# Patient Record
Sex: Female | Born: 1975 | Race: White | Hispanic: No | Marital: Single | Smoking: Former smoker
Health system: Southern US, Community
[De-identification: ages and names within clinical notes are randomized; demographics above are authoritative.]

## PROBLEM LIST (undated history)

## (undated) ENCOUNTER — Inpatient Hospital Stay (HOSPITAL_COMMUNITY): Payer: Self-pay

## (undated) DIAGNOSIS — J45909 Unspecified asthma, uncomplicated: Secondary | ICD-10-CM

## (undated) HISTORY — PX: NO PAST SURGERIES: SHX2092

---

## 2003-07-21 ENCOUNTER — Encounter: Admission: RE | Admit: 2003-07-21 | Discharge: 2003-07-21 | Payer: Self-pay | Admitting: Family Medicine

## 2003-07-21 ENCOUNTER — Other Ambulatory Visit: Admission: RE | Admit: 2003-07-21 | Discharge: 2003-07-21 | Payer: Self-pay | Admitting: Family Medicine

## 2003-08-04 ENCOUNTER — Encounter: Admission: RE | Admit: 2003-08-04 | Discharge: 2003-08-04 | Payer: Self-pay | Admitting: Family Medicine

## 2004-02-02 ENCOUNTER — Encounter: Admission: RE | Admit: 2004-02-02 | Discharge: 2004-02-02 | Payer: Self-pay | Admitting: Obstetrics and Gynecology

## 2004-05-22 ENCOUNTER — Encounter: Admission: RE | Admit: 2004-05-22 | Discharge: 2004-05-22 | Payer: Self-pay | Admitting: Obstetrics and Gynecology

## 2004-07-17 ENCOUNTER — Encounter: Admission: RE | Admit: 2004-07-17 | Discharge: 2004-07-17 | Payer: Self-pay | Admitting: Obstetrics and Gynecology

## 2004-08-06 ENCOUNTER — Ambulatory Visit (HOSPITAL_COMMUNITY): Admission: RE | Admit: 2004-08-06 | Discharge: 2004-08-06 | Payer: Self-pay | Admitting: Obstetrics and Gynecology

## 2004-08-06 ENCOUNTER — Ambulatory Visit: Payer: Self-pay | Admitting: Obstetrics and Gynecology

## 2004-08-16 ENCOUNTER — Inpatient Hospital Stay (HOSPITAL_COMMUNITY): Admission: AD | Admit: 2004-08-16 | Discharge: 2004-08-17 | Payer: Self-pay | Admitting: Family Medicine

## 2004-08-16 ENCOUNTER — Ambulatory Visit: Payer: Self-pay | Admitting: Obstetrics and Gynecology

## 2004-08-28 ENCOUNTER — Ambulatory Visit: Payer: Self-pay | Admitting: Obstetrics and Gynecology

## 2004-10-25 ENCOUNTER — Ambulatory Visit: Payer: Self-pay | Admitting: Obstetrics and Gynecology

## 2005-01-15 ENCOUNTER — Ambulatory Visit: Payer: Self-pay | Admitting: Obstetrics and Gynecology

## 2005-03-11 ENCOUNTER — Inpatient Hospital Stay (HOSPITAL_COMMUNITY): Admission: AD | Admit: 2005-03-11 | Discharge: 2005-03-11 | Payer: Self-pay | Admitting: Obstetrics and Gynecology

## 2005-03-13 ENCOUNTER — Inpatient Hospital Stay (HOSPITAL_COMMUNITY): Admission: AD | Admit: 2005-03-13 | Discharge: 2005-03-13 | Payer: Self-pay | Admitting: Obstetrics and Gynecology

## 2013-04-30 ENCOUNTER — Encounter (HOSPITAL_COMMUNITY): Payer: Self-pay

## 2013-04-30 ENCOUNTER — Telehealth: Payer: Self-pay | Admitting: Family Medicine

## 2013-04-30 ENCOUNTER — Inpatient Hospital Stay (HOSPITAL_COMMUNITY)
Admission: AD | Admit: 2013-04-30 | Discharge: 2013-04-30 | Disposition: A | Discharge status: Home / Self Care | Payer: Medicaid Other | Source: Ambulatory Visit | Attending: Obstetrics and Gynecology | Admitting: Obstetrics and Gynecology

## 2013-04-30 DIAGNOSIS — N949 Unspecified condition associated with female genital organs and menstrual cycle: Secondary | ICD-10-CM

## 2013-04-30 DIAGNOSIS — O2390 Unspecified genitourinary tract infection in pregnancy, unspecified trimester: Secondary | ICD-10-CM | POA: Insufficient documentation

## 2013-04-30 DIAGNOSIS — A499 Bacterial infection, unspecified: Secondary | ICD-10-CM

## 2013-04-30 DIAGNOSIS — N760 Acute vaginitis: Secondary | ICD-10-CM

## 2013-04-30 DIAGNOSIS — R109 Unspecified abdominal pain: Secondary | ICD-10-CM | POA: Insufficient documentation

## 2013-04-30 DIAGNOSIS — O4690 Antepartum hemorrhage, unspecified, unspecified trimester: Secondary | ICD-10-CM | POA: Insufficient documentation

## 2013-04-30 DIAGNOSIS — B9689 Other specified bacterial agents as the cause of diseases classified elsewhere: Secondary | ICD-10-CM

## 2013-04-30 HISTORY — DX: Unspecified asthma, uncomplicated: J45.909

## 2013-04-30 LAB — WET PREP, GENITAL
Trich, Wet Prep: NONE SEEN
Yeast Wet Prep HPF POC: NONE SEEN

## 2013-04-30 LAB — URINALYSIS, ROUTINE W REFLEX MICROSCOPIC
Bilirubin Urine: NEGATIVE
Ketones, ur: NEGATIVE mg/dL
Leukocytes, UA: NEGATIVE
Urobilinogen, UA: 1 mg/dL (ref 0.0–1.0)

## 2013-04-30 LAB — URINE MICROSCOPIC-ADD ON

## 2013-04-30 MED ORDER — ACETAMINOPHEN 500 MG PO TABS
500.0000 mg | ORAL_TABLET | Freq: Once | ORAL | Status: AC
  Administered 2013-04-30: 500 mg via ORAL
  Filled 2013-04-30: qty 1

## 2013-04-30 MED ORDER — METRONIDAZOLE 0.75 % VA GEL: 1.0000 | Freq: Two times a day (BID) | VAGINAL | Status: DC

## 2013-04-30 NOTE — Telephone Encounter (Signed)
Pt called MAU because Metrogel prescription too expensive.  Willing to take pills. Called in Rx to [facility] in [place]. Metronidazole 500 mg BID x 7 days

## 2013-04-30 NOTE — MAU Note (Signed)
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

## 2013-04-30 NOTE — MAU Note (Signed)
Patient states she started having lower abdominal pain for the past 2-3 days, worse last night and continues today. States she started having bright red spotting at 1500. No discharge on the pad patient is wearing. Light yellow discharge at this time.

## 2013-04-30 NOTE — MAU Provider Note (Signed)
History     CSN: [id number]  Arrival date and time: 04/30/13 1514   First Provider Initiated Contact with Patient 04/30/13 1613      Chief Complaint  Patient presents with  . Abdominal Pain  . Vaginal Bleeding   HPI Ms. Krista Sandoval is a 37 y.o. G2P1001 at [redacted]w[redacted]d who presents to MAU today with lower abdominal pain and vaginal bleeding. The patient states that she has had some lower abdominal cramping bilaterally and starting to radiate into her back. 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 The patient also states that she noted 2 episodes of spotting with wiping today. The patient states that she has a slight discharge that she was told was normal in the office. She denies LOF, N/V, fever, UTI symptoms or contractions. She reports good fetal movement.   OB History   Grav Para Term Preterm Abortions TAB SAB Ect Mult Living   2 1 1       1       Past Medical History  Diagnosis Date  . Asthma     Past Surgical History  Procedure Laterality Date  . No past surgeries      History reviewed. No pertinent family history.  History  Substance Use Topics  . Smoking status: Former Games developer  . Smokeless tobacco: Not on file  . Alcohol Use: No    Allergies: No Known Allergies  No prescriptions prior to admission    Review of Systems  Constitutional: Negative for fever and malaise/fatigue.  Gastrointestinal: Positive for abdominal pain. Negative for nausea, vomiting, diarrhea and constipation.  Genitourinary: Negative for dysuria, urgency and frequency.       + vaginal bleeding, discharge Neg - LOF  Musculoskeletal: Positive for back pain.   Physical Exam   Blood pressure 114/63, pulse 73, temperature 98.6 F (37 C), temperature source Oral, resp. rate 16, height 5\' 5"  (1.651 m), weight 164 lb (74.39 kg), SpO2 99.00%.  Physical Exam  Constitutional: She is oriented to person, place, and time. She appears  well-developed and well-nourished. No distress.  HENT:  Head: Normocephalic and atraumatic.  Cardiovascular: Normal rate, regular rhythm and normal heart sounds.   Respiratory: Effort normal and breath sounds normal. No respiratory distress.  GI: Soft. Bowel sounds are normal. She exhibits no distension and no mass. There is tenderness (mild lower abdominal tenderness to palpation). There is no rebound and no guarding.  Genitourinary: Uterus is enlarged (appropriate for GA). Uterus is not tender. Cervix exhibits no motion tenderness, no discharge and no friability. No bleeding around the vagina. Vaginal discharge (scant thin, white discharge noted) found.  Neurological: She is alert and oriented to person, place, and time.  Skin: Skin is warm and dry. No erythema.  Psychiatric: She has a normal mood and affect.  Dilation: Closed Effacement (%): Thick Exam by:: Naaman Plummer PA  Results for orders placed during the hospital encounter of 04/30/13 (from the past 24 hour(s))  URINALYSIS, ROUTINE W REFLEX MICROSCOPIC     Status: Abnormal   Collection Time    04/30/13  4:01 PM      Result Value Range   Color, Urine YELLOW  YELLOW   APPearance CLEAR  CLEAR   Specific Gravity, Urine 1.025  1.005 - 1.030   pH 6.5  5.0 - 8.0   Glucose, UA NEGATIVE  NEGATIVE mg/dL   Hgb urine dipstick SMALL (*) NEGATIVE   Bilirubin Urine  NEGATIVE  NEGATIVE   Ketones, ur NEGATIVE  NEGATIVE mg/dL   Protein, ur NEGATIVE  NEGATIVE mg/dL   Urobilinogen, UA 1.0  0.0 - 1.0 mg/dL   Nitrite NEGATIVE  NEGATIVE   Leukocytes, UA NEGATIVE  NEGATIVE  URINE MICROSCOPIC-ADD ON     Status: None   Collection Time    04/30/13  4:01 PM      Result Value Range   Squamous Epithelial / LPF RARE  RARE   RBC / HPF 7-10  <3 RBC/hpf   Bacteria, UA RARE  RARE  WET PREP, GENITAL     Status: Abnormal   Collection Time    04/30/13  4:25 PM      Result Value Range   Yeast Wet Prep HPF POC NONE SEEN  NONE SEEN   Trich, Wet Prep NONE SEEN   NONE SEEN   Clue Cells Wet Prep HPF POC FEW (*) NONE SEEN   WBC, Wet Prep HPF POC MODERATE (*) NONE SEEN     MAU Course  Procedures None  MDM UA, Wet prep Discussed with Dr. Tenny Craw. Ok to discharge. Rx for Metrogel. Follow-up as scheduled. Discuss round ligament pain.   Assessment and Plan  A: Round ligament pain Bacterial vaginosis  P: Dicharge home Rx for metrogel sent to patient's pharmacy Recommended abdominal binder, tylenol PRN and warm bath for round ligament pain Work excuse note given  Patient encouraged to follow-up in office as scheduled or sooner if symptoms should worsen or do not improve Patient may return to MAU as needed  Freddi Starr, PA-C  04/30/2013, 6:51 PM

## 2014-03-05 ENCOUNTER — Encounter (HOSPITAL_COMMUNITY): Payer: Self-pay | Admitting: *Deleted

## 2014-09-26 ENCOUNTER — Encounter (HOSPITAL_COMMUNITY): Payer: Self-pay | Admitting: *Deleted

## 2015-11-16 ENCOUNTER — Other Ambulatory Visit: Payer: Self-pay

## 2018-08-27 LAB — GLUCOSE, POCT (MANUAL RESULT ENTRY): POC Glucose: 98 mg/dl (ref 70–99)

## 2018-09-24 ENCOUNTER — Other Ambulatory Visit: Payer: Self-pay

## 2018-09-24 ENCOUNTER — Encounter (HOSPITAL_COMMUNITY): Payer: Self-pay | Admitting: Emergency Medicine

## 2018-09-24 ENCOUNTER — Emergency Department (HOSPITAL_COMMUNITY): Payer: Medicaid Other

## 2018-09-24 ENCOUNTER — Emergency Department (HOSPITAL_COMMUNITY)
Admission: EM | Admit: 2018-09-24 | Discharge: 2018-09-24 | Disposition: A | Discharge status: Home / Self Care | Payer: Medicaid Other | Attending: Emergency Medicine | Admitting: Emergency Medicine

## 2018-09-24 DIAGNOSIS — Z87891 Personal history of nicotine dependence: Secondary | ICD-10-CM | POA: Insufficient documentation

## 2018-09-24 DIAGNOSIS — D252 Subserosal leiomyoma of uterus: Secondary | ICD-10-CM

## 2018-09-24 DIAGNOSIS — R1031 Right lower quadrant pain: Secondary | ICD-10-CM | POA: Insufficient documentation

## 2018-09-24 DIAGNOSIS — N760 Acute vaginitis: Secondary | ICD-10-CM

## 2018-09-24 DIAGNOSIS — Z79899 Other long term (current) drug therapy: Secondary | ICD-10-CM | POA: Diagnosis not present

## 2018-09-24 DIAGNOSIS — B9689 Other specified bacterial agents as the cause of diseases classified elsewhere: Secondary | ICD-10-CM

## 2018-09-24 DIAGNOSIS — J45909 Unspecified asthma, uncomplicated: Secondary | ICD-10-CM | POA: Diagnosis not present

## 2018-09-24 LAB — URINALYSIS, ROUTINE W REFLEX MICROSCOPIC
BILIRUBIN URINE: NEGATIVE
Glucose, UA: NEGATIVE mg/dL
Hgb urine dipstick: NEGATIVE
Ketones, ur: NEGATIVE mg/dL
LEUKOCYTES UA: NEGATIVE
NITRITE: NEGATIVE
Protein, ur: NEGATIVE mg/dL
SPECIFIC GRAVITY, URINE: 1.019 (ref 1.005–1.030)
pH: 5 (ref 5.0–8.0)

## 2018-09-24 LAB — WET PREP, GENITAL
Trich, Wet Prep: NONE SEEN
Yeast Wet Prep HPF POC: NONE SEEN

## 2018-09-24 LAB — COMPREHENSIVE METABOLIC PANEL
ALT: 12 U/L (ref 0–44)
ANION GAP: 3 — AB (ref 5–15)
AST: 14 U/L — ABNORMAL LOW (ref 15–41)
Albumin: 3.5 g/dL (ref 3.5–5.0)
Alkaline Phosphatase: 67 U/L (ref 38–126)
BILIRUBIN TOTAL: 0.6 mg/dL (ref 0.3–1.2)
BUN: 13 mg/dL (ref 6–20)
CO2: 28 mmol/L (ref 22–32)
Calcium: 8.9 mg/dL (ref 8.9–10.3)
Chloride: 106 mmol/L (ref 98–111)
Creatinine, Ser: 0.97 mg/dL (ref 0.44–1.00)
Glucose, Bld: 85 mg/dL (ref 70–99)
Potassium: 3.7 mmol/L (ref 3.5–5.1)
Sodium: 137 mmol/L (ref 135–145)
TOTAL PROTEIN: 6.1 g/dL — AB (ref 6.5–8.1)

## 2018-09-24 LAB — CBC
HCT: 38.3 % (ref 36.0–46.0)
HEMOGLOBIN: 12.5 g/dL (ref 12.0–15.0)
MCH: 30.6 pg (ref 26.0–34.0)
MCHC: 32.6 g/dL (ref 30.0–36.0)
MCV: 93.6 fL (ref 80.0–100.0)
NRBC: 0 % (ref 0.0–0.2)
Platelets: 281 10*3/uL (ref 150–400)
RBC: 4.09 MIL/uL (ref 3.87–5.11)
RDW: 12.5 % (ref 11.5–15.5)
WBC: 6.6 10*3/uL (ref 4.0–10.5)

## 2018-09-24 LAB — I-STAT BETA HCG BLOOD, ED (MC, WL, AP ONLY)

## 2018-09-24 LAB — LIPASE, BLOOD: LIPASE: 42 U/L (ref 11–51)

## 2018-09-24 MED ORDER — IBUPROFEN 600 MG PO TABS: 600.0000 mg | ORAL_TABLET | Freq: Four times a day (QID) | ORAL | 0 refills | Status: AC | PRN

## 2018-09-24 MED ORDER — KETOROLAC TROMETHAMINE 30 MG/ML IJ SOLN
30.0000 mg | Freq: Once | INTRAMUSCULAR | Status: AC
  Administered 2018-09-24: 30 mg via INTRAVENOUS
  Filled 2018-09-24: qty 1

## 2018-09-24 MED ORDER — MORPHINE SULFATE (PF) 4 MG/ML IV SOLN
4.0000 mg | Freq: Once | INTRAVENOUS | Status: AC
  Administered 2018-09-24: 4 mg via INTRAVENOUS
  Filled 2018-09-24: qty 1

## 2018-09-24 MED ORDER — METRONIDAZOLE 500 MG PO TABS: 500.0000 mg | ORAL_TABLET | Freq: Two times a day (BID) | ORAL | 0 refills | Status: DC

## 2018-09-24 MED ORDER — ONDANSETRON HCL 4 MG/2ML IJ SOLN
4.0000 mg | Freq: Once | INTRAMUSCULAR | Status: AC
  Administered 2018-09-24: 4 mg via INTRAVENOUS
  Filled 2018-09-24: qty 2

## 2018-09-24 MED ORDER — IOPAMIDOL (ISOVUE-300) INJECTION 61%
100.0000 mL | Freq: Once | INTRAVENOUS | Status: AC | PRN
  Administered 2018-09-24: 100 mL via INTRAVENOUS

## 2018-09-24 MED ORDER — ONDANSETRON 4 MG PO TBDP: 4.0000 mg | ORAL_TABLET | Freq: Three times a day (TID) | ORAL | 0 refills | Status: DC | PRN

## 2018-09-24 NOTE — Discharge Instructions (Signed)
Thank you for allowing me to care for you today in the Emergency Department.   As we discussed, please call your OB/GYN today to schedule a follow-up appointment regarding your symptoms.  Take 600 mg of ibuprofen with food or 650 mg of Tylenol every 6 hours for pain control.  You can apply heating pads to areas that are sore to help with pain.  Let 1 tablet of Zofran dissolve in your tongue every 8 hours as needed for nausea or vomiting.  Take 1 tablet of Flagyl 2 times daily for the next week for bacterial vaginitis.  Avoid alcohol while you are taking this medication because it can cause vomiting.  Return to the emergency department if you develop new or worsening symptoms including high fever, heavy vaginal bleeding, persistent vomiting despite taking Zofran, or other new, concerning symptoms.

## 2018-09-24 NOTE — ED Provider Notes (Signed)
[facility] EMERGENCY DEPARTMENT Provider Note   CSN: [id number] Arrival date & time: 09/24/18  0518     History   Chief Complaint Chief Complaint  Patient presents with  . Abdominal Pain    HPI Krista Sandoval is a 42 y.o. female with history of asthma, DUB presents with acute onset lower abdominal pain that began this evening and woke her up out of sleep.  She describes it as starting in her pelvic area and radiating up toward her back.  She describes it mostly in her right lower quadrant and suprapubic area.  She denies any vaginal bleeding or discharge.  She has had urinary frequency lately, but denies any dysuria.  She denies any fevers, chest pain, shortness of breath.  She has had nausea, but no vomiting.  She denies any diarrhea or bloody stools.  She has been having more bowel movements than usual, but they have been normal.  No medications taken prior to arrival.  HPI  Past Medical History:  Diagnosis Date  . Asthma     There are no active problems to display for this patient.   Past Surgical History:  Procedure Laterality Date  . NO PAST SURGERIES       OB History    Gravida  2   Para  1   Term  1   Preterm      AB      Living  1     SAB      TAB      Ectopic      Multiple      Live Births               Home Medications    Prior to Admission medications   Medication Sig Start Date End Date Taking? Authorizing Provider  acetaminophen (TYLENOL) 325 MG tablet Take 650 mg by mouth every 6 (six) hours as needed for pain.    [provider]  budesonide (PULMICORT) 180 MCG/ACT inhaler Inhale 1 puff into the lungs 2 (two) times daily as needed (shortness of breath).    [provider]  metroNIDAZOLE (METROGEL VAGINAL) 0.75 % vaginal gel Place 1 Applicatorful vaginally 2 (two) times daily. 04/30/13   Luvenia Redden, PA-C  Prenatal Vit-Fe Fumarate-FA (PRENATAL MULTIVITAMIN) TABS Take 1 tablet by mouth daily at 12  noon.    [provider]    Family History No family history on file.  Social History Social History   Tobacco Use  . Smoking status: Former Research scientist (life sciences)  . Smokeless tobacco: Never Used  Substance Use Topics  . Alcohol use: No  . Drug use: No     Allergies   Amoxicillin   Review of Systems Review of Systems  Constitutional: Negative for chills and fever.  HENT: Negative for facial swelling and sore throat.   Respiratory: Negative for shortness of breath.   Cardiovascular: Negative for chest pain.  Gastrointestinal: Positive for abdominal pain and nausea. Negative for blood in stool, constipation, diarrhea and vomiting.  Genitourinary: Positive for flank pain, frequency and pelvic pain. Negative for dysuria, vaginal bleeding and vaginal discharge.  Musculoskeletal: Positive for back pain.  Skin: Negative for rash and wound.  Neurological: Negative for headaches.  Psychiatric/Behavioral: The patient is not nervous/anxious.      Physical Exam Updated Vital Signs BP 118/81 (BP Location: Right Arm)   Pulse 65   Temp 98.2 F (36.8 C) (Oral)   Resp 17   SpO2 98%  Physical Exam  Constitutional: She appears well-developed and well-nourished. No distress.  HENT:  Head: Normocephalic and atraumatic.  Mouth/Throat: Oropharynx is clear and moist. No oropharyngeal exudate.  Eyes: Pupils are equal, round, and reactive to light. Conjunctivae are normal. Right eye exhibits no discharge. Left eye exhibits no discharge. No scleral icterus.  Neck: Normal range of motion. Neck supple. No thyromegaly present.  Cardiovascular: Normal rate, regular rhythm, normal heart sounds and intact distal pulses. Exam reveals no gallop and no friction rub.  No murmur heard. Pulmonary/Chest: Effort normal and breath sounds normal. No stridor. No respiratory distress. She has no wheezes. She has no rales.  Abdominal: Soft. Bowel sounds are normal. She exhibits no distension. There is  tenderness in the right lower quadrant and suprapubic area. There is tenderness at McBurney's point (significant). There is no rebound, no guarding and no CVA tenderness.  Genitourinary: Uterus normal. Cervix exhibits no motion tenderness. Right adnexum displays tenderness (very mild). Left adnexum displays no tenderness. Vaginal discharge (clear/white, could be physiologic) found.  Musculoskeletal: She exhibits no edema.  Lymphadenopathy:    She has no cervical adenopathy.  Neurological: She is alert. Coordination normal.  Skin: Skin is warm and dry. No rash noted. She is not diaphoretic. No pallor.  Psychiatric: She has a normal mood and affect.  Nursing note and vitals reviewed.    ED Treatments / Results  Labs (all labs ordered are listed, but only abnormal results are displayed) Labs Reviewed  WET PREP, GENITAL - Abnormal; Notable for the following components:      Result Value   Clue Cells Wet Prep HPF POC PRESENT (*)    WBC, Wet Prep HPF POC MANY (*)    All other components within normal limits  COMPREHENSIVE METABOLIC PANEL - Abnormal; Notable for the following components:   Total Protein 6.1 (*)    AST 14 (*)    Anion gap 3 (*)    All other components within normal limits  URINE CULTURE  LIPASE, BLOOD  CBC  URINALYSIS, ROUTINE W REFLEX MICROSCOPIC  I-STAT BETA HCG BLOOD, ED ([facility], [facility], [facility] ONLY)  GC/CHLAMYDIA PROBE AMP ([place]) NOT AT [facility]    EKG None  Radiology No results found.  Procedures Procedures (including critical care time)  Medications Ordered in ED Medications  morphine 4 MG/ML injection 4 mg (4 mg Intravenous Given 09/24/18 0611)  ondansetron (ZOFRAN) injection 4 mg (4 mg Intravenous Given 09/24/18 5170)     Initial Impression / Assessment and Plan / ED Course  I have reviewed the triage vital signs and the nursing notes.  Pertinent labs & imaging results that were available during my care of the patient were reviewed by me and considered in  my medical decision making (see chart for details).     Patient presenting with acute onset right lower quadrant pain.  She has right lower quadrant and suprapubic tenderness severe on my exam.  Labs unremarkable except for clue cells, WBCs, and sperm on wet prep.  At shift change, patient care transferred to [facility], PA-C for continued evaluation, follow up of CTAP and determination of disposition.   Final Clinical Impressions(s) / ED Diagnoses   Final diagnoses:  Right lower quadrant abdominal pain    ED Discharge Orders    None       Frederica Kuster, PA-C 09/24/18 0174    Veryl Speak, MD 09/24/18 [phone]

## 2018-09-24 NOTE — ED Notes (Signed)
Pt reports having abd pains that radiates to her vagina that woke her up.

## 2018-09-24 NOTE — ED Provider Notes (Signed)
42 year old female received at sign out from [facility] pending CT A/P. Per her HPI:   "Krista Sandoval is a 42 y.o. female with history of asthma, DUB presents with acute onset lower abdominal pain that began this evening and woke her up out of sleep.  She describes it as starting in her pelvic area and radiating up toward her back.  She describes it mostly in her right lower quadrant and suprapubic area.  She denies any vaginal bleeding or discharge.  She has had urinary frequency lately, but denies any dysuria.  She denies any fevers, chest pain, shortness of breath.  She has had nausea, but no vomiting.  She denies any diarrhea or bloody stools.  She has been having more bowel movements than usual, but they have been normal.  No medications taken prior to arrival."  Physical Exam  BP 116/74   Pulse (!) 53   Temp 98.2 F (36.8 C) (Oral)   Resp 18   SpO2 99%   Physical Exam  Constitutional: No distress.  HENT:  Head: Normocephalic.  Eyes: Conjunctivae are normal.  Neck: Neck supple.  Cardiovascular: Normal rate, regular rhythm, normal heart sounds and intact distal pulses. Exam reveals no gallop and no friction rub.  No murmur heard. Pulmonary/Chest: Effort normal and breath sounds normal. No stridor. No respiratory distress. She has no wheezes. She has no rales. She exhibits no tenderness.  Abdominal: Soft. Bowel sounds are normal. She exhibits no distension and no mass. There is tenderness. There is no rebound and no guarding. No hernia.  Abdomen is soft, nondistended.  No rebound or guarding.  She has mild right lower quadrant, right-sided pelvic discomfort.  Neurological: She is alert.  Skin: Skin is warm. No rash noted.  Psychiatric: Her behavior is normal.  Nursing note and vitals reviewed.   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  Patient was received at signout from [facility]  pending CT A/P.  Wet prep with clue cells.  We will discharge the patient with metronidazole.  She was advised to avoid alcohol.  She was given Toradol for pain control with significant improvement in her symptoms.  CT abdomen pelvis with normal appendix.  Her uterine fibroid measuring 3.2 cm was again visualized.  No other acute findings.  Discussed these findings with the patient.  Doubt pancreatitis, appendicitis, nephrolithiasis, pyelonephritis, diverticulitis, ovarian torsion, or cholecystitis.  Recommended she follow-up with OB/GYN as per record review she was on a trial of OE with consideration of TLH if she failed the trial.  Will discharge with Zofran for nausea and ibuprofen for pain control.  Strict return precautions given.  She is hemodynamically stable and in no acute distress.  She is safe for discharge home with outpatient follow-up at this time.     Joline Maxcy A, PA-C 09/24/18 7902    Veryl Speak, MD 09/24/18 2326

## 2018-09-24 NOTE — ED Triage Notes (Signed)
Pt presents with lower abdominal pain radiating to "private area" Has been worked up by her GP for this. No urinary symptoms. Pt states she has had kidney stones in the past but this does not feel the same. Rates pain 10/10

## 2018-09-25 LAB — URINE CULTURE: Culture: NO GROWTH

## 2018-09-25 LAB — GC/CHLAMYDIA PROBE AMP (~~LOC~~) NOT AT ARMC
Chlamydia: NEGATIVE
Neisseria Gonorrhea: NEGATIVE

## 2018-11-03 ENCOUNTER — Other Ambulatory Visit: Payer: Self-pay

## 2018-11-03 ENCOUNTER — Emergency Department (HOSPITAL_COMMUNITY)
Admission: EM | Admit: 2018-11-03 | Discharge: 2018-11-03 | Disposition: A | Discharge status: Home / Self Care | Payer: Medicaid Other | Attending: Emergency Medicine | Admitting: Emergency Medicine

## 2018-11-03 ENCOUNTER — Encounter (HOSPITAL_COMMUNITY): Payer: Self-pay | Admitting: Emergency Medicine

## 2018-11-03 ENCOUNTER — Emergency Department (HOSPITAL_COMMUNITY): Payer: Medicaid Other

## 2018-11-03 DIAGNOSIS — D251 Intramural leiomyoma of uterus: Secondary | ICD-10-CM | POA: Diagnosis not present

## 2018-11-03 DIAGNOSIS — J45909 Unspecified asthma, uncomplicated: Secondary | ICD-10-CM | POA: Insufficient documentation

## 2018-11-03 DIAGNOSIS — Z79899 Other long term (current) drug therapy: Secondary | ICD-10-CM | POA: Diagnosis not present

## 2018-11-03 DIAGNOSIS — K5792 Diverticulitis of intestine, part unspecified, without perforation or abscess without bleeding: Secondary | ICD-10-CM

## 2018-11-03 DIAGNOSIS — Z87891 Personal history of nicotine dependence: Secondary | ICD-10-CM | POA: Diagnosis not present

## 2018-11-03 DIAGNOSIS — E860 Dehydration: Secondary | ICD-10-CM | POA: Diagnosis not present

## 2018-11-03 DIAGNOSIS — R1084 Generalized abdominal pain: Secondary | ICD-10-CM | POA: Diagnosis present

## 2018-11-03 LAB — URINALYSIS, ROUTINE W REFLEX MICROSCOPIC
Bilirubin Urine: NEGATIVE
GLUCOSE, UA: NEGATIVE mg/dL
Hgb urine dipstick: NEGATIVE
KETONES UR: 5 mg/dL — AB
LEUKOCYTES UA: NEGATIVE
Nitrite: NEGATIVE
PROTEIN: NEGATIVE mg/dL
Specific Gravity, Urine: 1.019 (ref 1.005–1.030)
pH: 6 (ref 5.0–8.0)

## 2018-11-03 LAB — CBC
HCT: 39.9 % (ref 36.0–46.0)
Hemoglobin: 13.1 g/dL (ref 12.0–15.0)
MCH: 30.5 pg (ref 26.0–34.0)
MCHC: 32.8 g/dL (ref 30.0–36.0)
MCV: 92.8 fL (ref 80.0–100.0)
NRBC: 0 % (ref 0.0–0.2)
PLATELETS: 249 10*3/uL (ref 150–400)
RBC: 4.3 MIL/uL (ref 3.87–5.11)
RDW: 11.9 % (ref 11.5–15.5)
WBC: 12.6 10*3/uL — AB (ref 4.0–10.5)

## 2018-11-03 LAB — COMPREHENSIVE METABOLIC PANEL
ALK PHOS: 69 U/L (ref 38–126)
ALT: 15 U/L (ref 0–44)
ANION GAP: 7 (ref 5–15)
AST: 16 U/L (ref 15–41)
Albumin: 3.6 g/dL (ref 3.5–5.0)
BILIRUBIN TOTAL: 1.1 mg/dL (ref 0.3–1.2)
BUN: 8 mg/dL (ref 6–20)
CALCIUM: 8.8 mg/dL — AB (ref 8.9–10.3)
CO2: 24 mmol/L (ref 22–32)
CREATININE: 0.8 mg/dL (ref 0.44–1.00)
Chloride: 104 mmol/L (ref 98–111)
Glucose, Bld: 131 mg/dL — ABNORMAL HIGH (ref 70–99)
Potassium: 3.8 mmol/L (ref 3.5–5.1)
SODIUM: 135 mmol/L (ref 135–145)
TOTAL PROTEIN: 6 g/dL — AB (ref 6.5–8.1)

## 2018-11-03 LAB — I-STAT BETA HCG BLOOD, ED (MC, WL, AP ONLY)

## 2018-11-03 LAB — LIPASE, BLOOD: Lipase: 37 U/L (ref 11–51)

## 2018-11-03 MED ORDER — ONDANSETRON 4 MG PO TBDP: 4.0000 mg | ORAL_TABLET | Freq: Three times a day (TID) | ORAL | 0 refills | Status: DC | PRN

## 2018-11-03 MED ORDER — METRONIDAZOLE IN NACL 5-0.79 MG/ML-% IV SOLN
500.0000 mg | Freq: Once | INTRAVENOUS | Status: AC
  Administered 2018-11-03: 500 mg via INTRAVENOUS
  Filled 2018-11-03: qty 100

## 2018-11-03 MED ORDER — HYDROCODONE-ACETAMINOPHEN 5-325 MG PO TABS: 1.0000 | ORAL_TABLET | ORAL | 0 refills | Status: DC | PRN

## 2018-11-03 MED ORDER — SODIUM CHLORIDE 0.9 % IV BOLUS
1000.0000 mL | Freq: Once | INTRAVENOUS | Status: AC
  Administered 2018-11-03: 1000 mL via INTRAVENOUS

## 2018-11-03 MED ORDER — ONDANSETRON 4 MG PO TBDP
4.0000 mg | ORAL_TABLET | Freq: Once | ORAL | Status: AC
  Administered 2018-11-03: 4 mg via ORAL
  Filled 2018-11-03: qty 1

## 2018-11-03 MED ORDER — METRONIDAZOLE 500 MG PO TABS: 500.0000 mg | ORAL_TABLET | Freq: Three times a day (TID) | ORAL | 0 refills | Status: AC

## 2018-11-03 MED ORDER — MORPHINE SULFATE (PF) 4 MG/ML IV SOLN
6.0000 mg | Freq: Once | INTRAVENOUS | Status: AC
  Administered 2018-11-03: 6 mg via INTRAVENOUS
  Filled 2018-11-03: qty 2

## 2018-11-03 MED ORDER — LEVOFLOXACIN 750 MG PO TABS
750.0000 mg | ORAL_TABLET | Freq: Once | ORAL | Status: AC
  Administered 2018-11-03: 750 mg via ORAL
  Filled 2018-11-03: qty 1

## 2018-11-03 MED ORDER — LEVOFLOXACIN 750 MG PO TABS: 750.0000 mg | ORAL_TABLET | Freq: Every day | ORAL | 0 refills | Status: AC

## 2018-11-03 MED ORDER — HYDROCODONE-ACETAMINOPHEN 5-325 MG PO TABS
2.0000 | ORAL_TABLET | Freq: Once | ORAL | Status: AC
  Administered 2018-11-03: 2 via ORAL
  Filled 2018-11-03: qty 2

## 2018-11-03 MED ORDER — DICYCLOMINE HCL 20 MG PO TABS: 20.0000 mg | ORAL_TABLET | Freq: Three times a day (TID) | ORAL | 0 refills | Status: DC

## 2018-11-03 MED ORDER — IOHEXOL 300 MG/ML  SOLN
100.0000 mL | Freq: Once | INTRAMUSCULAR | Status: AC | PRN
  Administered 2018-11-03: 100 mL via INTRAVENOUS

## 2018-11-03 MED ORDER — ONDANSETRON HCL 4 MG/2ML IJ SOLN
4.0000 mg | Freq: Once | INTRAMUSCULAR | Status: AC
  Administered 2018-11-03: 4 mg via INTRAVENOUS
  Filled 2018-11-03: qty 2

## 2018-11-03 NOTE — ED Provider Notes (Signed)
[facility] Provider Note   CSN: [id number] Arrival date & time: 11/03/18  1444     History   Chief Complaint Chief Complaint  Patient presents with  . Abdominal Pain    HPI Krista Sandoval is a 42 y.o. female.  HPI   42 yo F with h/o asthma here with abd pain. Pt reports acute onset of aching, cramp like, initially moderate now severe epigastric pain this morning. She did not eat breakfast. She had associated nausea but no vomiting. Since then, the pain has now spread to b/l LE and diffusely throughout abdomen. It is constant but worsening, aching, gnawing. SHe's had 4-5 soft BM as well but no diarrhea, though this is not abnormal for her. Denies any VB or discharge. No urinary sx. Pain is worse with palpation, eating. No alleviating factors. She feels distended. No h/o bowel obstructions or surgeries.  Past Medical History:  Diagnosis Date  . Asthma     There are no active problems to display for this patient.   Past Surgical History:  Procedure Laterality Date  . NO PAST SURGERIES       OB History    Gravida  2   Para  1   Term  1   Preterm      AB      Living  1     SAB      TAB      Ectopic      Multiple      Live Births               Home Medications    Prior to Admission medications   Medication Sig Start Date End Date Taking? Authorizing Provider  Multiple Vitamins-Minerals (MULTIVITAMIN PO) Take 1 tablet by mouth daily.   Yes [provider]  Probiotic Product (PROBIOTIC PO) Take 1 tablet by mouth daily.   Yes [provider]  dicyclomine (BENTYL) 20 MG tablet Take 1 tablet (20 mg total) by mouth 4 (four) times daily -  before meals and at bedtime for 5 days. 11/03/18 11/08/18  Duffy Bruce, MD  HYDROcodone-acetaminophen (NORCO/VICODIN) 5-325 MG tablet Take 1-2 tablets by mouth every 4 (four) hours as needed for moderate pain or severe pain. 11/03/18   Duffy Bruce, MD    ibuprofen (ADVIL,MOTRIN) 600 MG tablet Take 1 tablet (600 mg total) by mouth every 6 (six) hours as needed. Patient not taking: Reported on 11/03/2018 09/24/18   McDonald, Maree Erie A, PA-C  levofloxacin (LEVAQUIN) 750 MG tablet Take 1 tablet (750 mg total) by mouth daily for 7 days. 11/03/18 11/10/18  Duffy Bruce, MD  metroNIDAZOLE (FLAGYL) 500 MG tablet Take 1 tablet (500 mg total) by mouth 3 (three) times daily for 7 days. 11/03/18 11/10/18  Duffy Bruce, MD  ondansetron (ZOFRAN ODT) 4 MG disintegrating tablet Take 1 tablet (4 mg total) by mouth every 8 (eight) hours as needed for nausea or vomiting. 11/03/18   Duffy Bruce, MD    Family History No family history on file.  Social History Social History   Tobacco Use  . Smoking status: Former Research scientist (life sciences)  . Smokeless tobacco: Never Used  Substance Use Topics  . Alcohol use: No  . Drug use: No     Allergies   Amoxicillin   Review of Systems Review of Systems  Constitutional: Positive for fatigue. Negative for chills and fever.  HENT: Negative for congestion and rhinorrhea.   Eyes: Negative for visual disturbance.  Respiratory:  Negative for cough, shortness of breath and wheezing.   Cardiovascular: Negative for chest pain and leg swelling.  Gastrointestinal: Positive for abdominal distention, abdominal pain and nausea. Negative for diarrhea and vomiting.  Genitourinary: Negative for dysuria and flank pain.  Musculoskeletal: Negative for neck pain and neck stiffness.  Skin: Negative for rash and wound.  Allergic/Immunologic: Negative for immunocompromised state.  Neurological: Negative for syncope, weakness and headaches.  All other systems reviewed and are negative.    Physical Exam Updated Vital Signs BP 111/66 (BP Location: Left Arm)   Pulse 78   Temp 98.4 F (36.9 C) (Oral)   Resp 18   Wt 68 kg   LMP 08/04/2018   SpO2 100%   BMI 24.96 kg/m   Physical Exam  Constitutional: She is oriented to person, place,  and time. She appears well-developed and well-nourished. No distress.  HENT:  Head: Normocephalic and atraumatic.  Eyes: Conjunctivae are normal.  Neck: Neck supple.  Cardiovascular: Normal rate, regular rhythm and normal heart sounds. Exam reveals no friction rub.  No murmur heard. Pulmonary/Chest: Effort normal and breath sounds normal. No respiratory distress. She has no wheezes. She has no rales.  Abdominal: She exhibits no distension. There is generalized tenderness and tenderness in the epigastric area. There is guarding. There is no rigidity, no rebound and no CVA tenderness.  Musculoskeletal: She exhibits no edema.  Neurological: She is alert and oriented to person, place, and time. She exhibits normal muscle tone.  Skin: Skin is warm. Capillary refill takes less than 2 seconds.  Psychiatric: She has a normal mood and affect.  Nursing note and vitals reviewed.    ED Treatments / Results  Labs (all labs ordered are listed, but only abnormal results are displayed) Labs Reviewed  COMPREHENSIVE METABOLIC PANEL - Abnormal; Notable for the following components:      Result Value   Glucose, Bld 131 (*)    Calcium 8.8 (*)    Total Protein 6.0 (*)    All other components within normal limits  CBC - Abnormal; Notable for the following components:   WBC 12.6 (*)    All other components within normal limits  URINALYSIS, ROUTINE W REFLEX MICROSCOPIC - Abnormal; Notable for the following components:   APPearance HAZY (*)    Ketones, ur 5 (*)    All other components within normal limits  LIPASE, BLOOD  I-STAT BETA HCG BLOOD, ED ([facility], [facility], [facility] ONLY)    EKG None  Radiology Ct Abdomen Pelvis W Contrast  Result Date: 11/03/2018 CLINICAL DATA:  Abdominal pain with nausea and vomiting EXAM: CT ABDOMEN AND PELVIS WITH CONTRAST TECHNIQUE: Multidetector CT imaging of the abdomen and pelvis was performed using the standard protocol following bolus administration of intravenous contrast.  CONTRAST:  154mL OMNIPAQUE IOHEXOL 300 MG/ML  SOLN COMPARISON:  September 24, 2018 FINDINGS: Lower chest: Lung bases are clear. Hepatobiliary: No focal liver lesions are appreciable. There is slight fatty infiltration near the fissure for the ligamentum teres. Gallbladder wall is not appreciably thickened. There is no biliary duct dilatation. Pancreas: No pancreatic mass or inflammatory focus. Spleen: No splenic lesions are evident. Adrenals/Urinary Tract: Adrenals bilaterally appear unremarkable. Kidneys bilaterally show no evident mass or hydronephrosis on either side. There is no evident renal or ureteral calculus on either side. Urinary bladder is midline with wall thickness within normal limits. Stomach/Bowel: There are sigmoid diverticula without diverticulitis. Several loops of borderline wall thickening are noted in the sigmoid colon with mild enhancement of the  walls of these portions of the sigmoid colon, felt to represent a degree sigmoid colitis. No other bowel wall thickening evident. There is no appreciable mesenteric thickening. No evident bowel obstruction. There is no free air or portal venous air. Vascular/Lymphatic: There is aortic and proximal iliac artery atherosclerosis. No evident aneurysm. Major mesenteric arterial vessels appear patent. There is no evident adenopathy in the abdomen or pelvis. Reproductive: Uterus is anteverted. There is an enhancing mass in the left uterine fundus measuring 1.8 x 1.7 cm, a likely leiomyoma. Uterus is somewhat inhomogeneous elsewhere. Prominence along the rightward aspect of the lower uterine segment may also represent leiomyomatous change, similar to prior study. This area measures 2.9 x 2.8 cm. No extrauterine pelvic mass is seen. There is moderate fluid in the cul-de-sac region. There are rather prominent vascular structures in the periuterine region bilaterally. Other: Appendix appears normal. There is no abscess in the abdomen or pelvis. There is no  ascites beyond the fluid in the dependent portion of the pelvis. Musculoskeletal: There are no blastic or lytic bone lesions. There is no intramuscular or abdominal wall lesion. IMPRESSION: 1. Findings felt to represent a degree of distal sigmoid colitis, nonspecific. There are sigmoid diverticula more proximally without diverticulitis. 2 leiomyomatous uterus. 3. Prominent pelvic vessels which may be indicative of a degree of pelvic congestion syndrome. Clinical assessment in this regard advised. 4. Fluid in the dependent portion the pelvis noted. Question reactive fluid secondary to the nearby distal sigmoid colitis versus recent ovarian cyst rupture. 5. No abscess in the abdomen pelvis. No bowel obstruction. Appendix appears normal. 6.  No evident renal or ureteral calculus.  No hydronephrosis. 7.  Aortoiliac atherosclerosis. Aortic Atherosclerosis (ICD10-I70.0). Electronically Signed   By: Lowella Grip III M.D.   On: 11/03/2018 18:58    Procedures Procedures (including critical care time)  Medications Ordered in ED Medications  sodium chloride 0.9 % bolus 1,000 mL (0 mLs Intravenous Stopped 11/03/18 1610)  ondansetron (ZOFRAN) injection 4 mg (4 mg Intravenous Given 11/03/18 1536)  morphine 4 MG/ML injection 6 mg (6 mg Intravenous Given 11/03/18 1537)  sodium chloride 0.9 % bolus 1,000 mL (0 mLs Intravenous Stopped 11/03/18 1910)  iohexol (OMNIPAQUE) 300 MG/ML solution 100 mL (100 mLs Intravenous Contrast Given 11/03/18 1802)  levofloxacin (LEVAQUIN) tablet 750 mg (750 mg Oral Given 11/03/18 1917)  metroNIDAZOLE (FLAGYL) IVPB 500 mg (0 mg Intravenous Stopped 11/03/18 2000)  HYDROcodone-acetaminophen (NORCO/VICODIN) 5-325 MG per tablet 2 tablet (2 tablets Oral Given 11/03/18 1917)  ondansetron (ZOFRAN-ODT) disintegrating tablet 4 mg (4 mg Oral Given 11/03/18 2035)     Initial Impression / Assessment and Plan / ED Course  I have reviewed the triage vital signs and the nursing  notes.  Pertinent labs & imaging results that were available during my care of the patient were reviewed by me and considered in my medical decision making (see chart for details).     42 yo F here w/ diffuse abd pain, nausea, and cramping. VSS and WNL. Exam as above. DDx includes pancreatitis, gastritis, IBS, less likely but also cholecystitis, appendicitis. No vaginal bleeding, discharge, or lower abd pain. No h/o torsion or ovarian cyst. Will check CT, labs, give analgesia.  Labs, imaging as above. Pt with mild leukocytosis, normal renal function. UA without signs of UTI. UPT neg. CT imaging shows likely uncomplicated diverticulitis, also consideration of recent cyst rupture. Given her leukocytosis and bowel frequency, wills tart on empiric ABX - unfortunately has allergy to amox so will rx  levaquin/flagyl. Otherwise, discussed need for outpt OB fu for her CT findings and will give supportive care. She is tolerating PO otherwise well appearing.  Final Clinical Impressions(s) / ED Diagnoses   Final diagnoses:  Diverticulitis  Intramural leiomyoma of uterus  Dehydration    ED Discharge Orders         Ordered    levofloxacin (LEVAQUIN) 750 MG tablet  Daily     11/03/18 1959    metroNIDAZOLE (FLAGYL) 500 MG tablet  3 times daily     11/03/18 1959    ondansetron (ZOFRAN ODT) 4 MG disintegrating tablet  Every 8 hours PRN     11/03/18 1959    HYDROcodone-acetaminophen (NORCO/VICODIN) 5-325 MG tablet  Every 4 hours PRN     11/03/18 1959    dicyclomine (BENTYL) 20 MG tablet  3 times daily before meals & bedtime     11/03/18 1959           Duffy Bruce, MD 11/04/18 0157

## 2018-11-03 NOTE — ED Triage Notes (Signed)
Pt in from home with c/o generalized abdominal pain, n/v since 1015 this am. States pain sharp, is also c/o distention. Reports 4 BM's today but denies diarrhea. Still has gall bladder, has hx of fibroid tumor

## 2018-11-03 NOTE — Discharge Instructions (Addendum)
For your diverticulitis (colon infection): -Follow-up with your primary doctor in the next week.  Because of your age, you should have a referral to a GI specialist and colonoscopy performed after resolution of your current infection, for further treatment and evaluation.  Make sure your doctor is aware of this.  For your fibroids, continue to follow-up with your OB/GYN.  Of note, your CT scan did show possible pelvic congestion, which is increased blood flow and could be contributing to your pain.  Discussed this with your OB/GYN.

## 2018-11-03 NOTE — ED Notes (Signed)
Patient transported to CT 

## 2018-11-03 NOTE — ED Notes (Signed)
Pt went outside and vomited, undo discharge to give ODT zofran

## 2019-09-21 ENCOUNTER — Other Ambulatory Visit: Payer: Self-pay

## 2019-09-21 ENCOUNTER — Ambulatory Visit (INDEPENDENT_AMBULATORY_CARE_PROVIDER_SITE_OTHER): Payer: Medicaid Other | Admitting: Allergy and Immunology

## 2019-09-21 ENCOUNTER — Encounter: Payer: Self-pay | Admitting: Allergy and Immunology

## 2019-09-21 VITALS — BP 110/68 | HR 64 | Temp 98.3°F | Resp 16 | Ht 64.0 in | Wt 164.0 lb

## 2019-09-21 DIAGNOSIS — G43909 Migraine, unspecified, not intractable, without status migrainosus: Secondary | ICD-10-CM | POA: Diagnosis not present

## 2019-09-21 DIAGNOSIS — J3089 Other allergic rhinitis: Secondary | ICD-10-CM

## 2019-09-21 DIAGNOSIS — R040 Epistaxis: Secondary | ICD-10-CM

## 2019-09-21 DIAGNOSIS — Z77120 Contact with and (suspected) exposure to mold (toxic): Secondary | ICD-10-CM

## 2019-09-21 NOTE — Patient Instructions (Addendum)
  1.  Allergen avoidance measures  2.  Eliminate household water infiltration and dry up house with dehumidifier  3.  Continue Topamax for at least 6 months and eliminate all caffeine consumption  4.  Contact clinic of recurrent nosebleeds in the future  5.  Further evaluation treatment?  6.  Obtain fall flu vaccine (and Covid vaccine)

## 2019-09-21 NOTE — Progress Notes (Signed)
[place]   Dear [facility],  Thank you for referring Krista Sandoval to the [facility] on 09/21/2019.   Below is a summation of this patient's evaluation and recommendations.  Thank you for your referral. I will keep you informed about this patient's response to treatment.   If you have any questions please do not hesitate to contact me.   Sincerely,  Jiles Prows, MD Allergy / Immunology [place]   ______________________________________________________________________    NEW PATIENT NOTE  Referring Provider: Alanson Puls, [facility] * Primary Provider: Alanson Puls, [facility] Date of office visit: 09/21/2019    Subjective:   Chief Complaint:  Krista Sandoval (DOB: [birth year]) is a 43 y.o. female who presents to the clinic on 09/21/2019 with a chief complaint of Allergy Testing (house is old, concerned for mold), Asthma (coughs at night when sleeping, no inhaler use, does not have one), Headache, Epistaxis, and Immunotherapy .     HPI: Krista Sandoval presents to this clinic in evaluation of 3 main issues.  First, she appears to have migraine headaches manifested as a pounding headache usually located in the back of her head but sometimes on her forehead that is associated with blurry vision, dots in her vision, colors in her vision, dizziness, and nausea that was occurring about every other day for at least the past 6 months if not longer.  Prior to that point time it was an intermittent issue for years.  She was being treated with tramadol which did not result in good control of this issue.  Most recently she was treated with Topamax at 25 mg at bedtime and for the past 2 weeks she has not had any headaches at all.  She has also been given a triptan.  She does occasionally drink 20 ounces of Mountain Dew per day or maybe a  tea per day and she does not consume chocolate or eat alcohol and her sleep is very good.  Second, she has had an issue with epistaxis on the left side that also appears to have occurred over the course of the past 6 months and may actually correlate with one of her headaches.  It is interesting to note that she has not had any epistaxis in the past 2 weeks which is the same period of time which she has had no headaches.  She does not have any pain in her nose and she has not noticed any ugly nasal discharge and she has no nasal congestion and no significant upper airway symptoms to accompany this issue.  She does appear to have a very minimal issue associated with upper airway symptoms consistent with allergic rhinitis that usually do not require any therapy.  Third, she is apparently been living in a mold infested house that fortunately is going to be repaired next week.  She is worried that she may have developed some type of mold issue with this exposure and she would like to be tested for mold allergy.  She may have a little bit of a cough at nighttime but no chest tightness or shortness of breath or sputum production or other lower respiratory tract symptoms with this issue.  Past Medical History:  Diagnosis Date   Asthma     Past Surgical History:  Procedure Laterality Date   NO PAST SURGERIES      Allergies as of  09/21/2019      Reactions   Amoxicillin Shortness Of Breath      Medication List      ibuprofen 600 MG tablet Commonly known as: ADVIL Take 1 tablet (600 mg total) by mouth every 6 (six) hours as needed.   MULTIVITAMIN PO Take 1 tablet by mouth daily. Prenatal vitamin   PREBIOTIC PRODUCT PO Take by mouth.   PROBIOTIC PO Take 1 tablet by mouth daily.   Topamax 25 MG tablet Generic drug: topiramate Take 25 mg by mouth 2 (two) times daily.       Review of systems negative except as noted in HPI / PMHx or noted below:  Review of Systems  Constitutional:  Negative.   HENT: Negative.   Eyes: Negative.   Respiratory: Negative.   Cardiovascular: Negative.   Gastrointestinal: Negative.   Genitourinary: Negative.   Musculoskeletal: Negative.   Skin: Negative.   Neurological: Negative.   Endo/Heme/Allergies: Negative.   Psychiatric/Behavioral: Negative.     Family History  Problem Relation Age of Onset   Hypertension Mother    COPD Mother    Hyperlipidemia Mother    Asthma Father     Social History   Socioeconomic History   Marital status: Single    Spouse name: Not on file   Number of children: Not on file   Years of education: Not on file   Highest education level: Not on file  Occupational History   Not on file  Social Needs   Financial resource strain: Not on file   Food insecurity    Worry: Not on file    Inability: Not on file   Transportation needs    Medical: Not on file    Non-medical: Not on file  Tobacco Use   Smoking status: Former Smoker    Types: Cigarettes    Quit date: 04/20/2017    Years since quitting: 2.4   Smokeless tobacco: Never Used  Substance and Sexual Activity   Alcohol use: No   Drug use: No   Sexual activity: Yes  Lifestyle   Physical activity    Days per week: Not on file    Minutes per session: Not on file   Stress: Not on file  Relationships   Social connections    Talks on phone: Not on file    Gets together: Not on file    Attends religious service: Not on file    Active member of club or organization: Not on file    Attends meetings of clubs or organizations: Not on file    Relationship status: Not on file   Intimate partner violence    Fear of current or ex partner: Not on file    Emotionally abused: Not on file    Physically abused: Not on file    Forced sexual activity: Not on file  Other Topics Concern   Not on file  Social History Narrative   Not on file    Environmental and Social history  Lives in a house with a damp environment and  mold growth, no animals located inside the household, vinyl in the bedroom, plastic on the bed, no plastic on the pillow, no smoking ongoing with inside the household.  Objective:   Vitals:   09/21/19 1432  BP: 110/68  Pulse: 64  Resp: 16  Temp: 98.3 F (36.8 C)  SpO2: 96%   Height: 5\' 4"  (162.6 cm) Weight: 164 lb (74.4 kg)  Physical Exam Constitutional:  Appearance: She is not diaphoretic.  HENT:     Head: Normocephalic. No right periorbital erythema or left periorbital erythema.     Right Ear: Tympanic membrane, ear canal and external ear normal.     Left Ear: Tympanic membrane, ear canal and external ear normal.     Nose: Nose normal. No mucosal edema or rhinorrhea.     Mouth/Throat:     Pharynx: Uvula midline. No oropharyngeal exudate.  Eyes:     General: Lids are normal.     Conjunctiva/sclera: Conjunctivae normal.     Pupils: Pupils are equal, round, and reactive to light.  Neck:     Thyroid: No thyromegaly.     Trachea: Trachea normal. No tracheal tenderness or tracheal deviation.  Cardiovascular:     Rate and Rhythm: Normal rate and regular rhythm.     Heart sounds: Normal heart sounds, S1 normal and S2 normal. No murmur.  Pulmonary:     Effort: Pulmonary effort is normal. No respiratory distress.     Breath sounds: Normal breath sounds. No stridor. No wheezing or rales.  Chest:     Chest wall: No tenderness.  Abdominal:     General: There is no distension.     Palpations: Abdomen is soft. There is no mass.     Tenderness: There is no abdominal tenderness. There is no guarding or rebound.  Musculoskeletal:        General: No tenderness.  Lymphadenopathy:     Head:     Right side of head: No tonsillar adenopathy.     Left side of head: No tonsillar adenopathy.     Cervical: No cervical adenopathy.  Skin:    Coloration: Skin is not pale.     Findings: No erythema or rash.     Nails: There is no clubbing.   Neurological:     Mental Status: She is  alert.     Diagnostics: Allergy skin tests were performed.  She demonstrated hypersensitivity to grasses and weeds and trees and molds.  Assessment and Plan:    1. Migraine syndrome   2. Left-sided epistaxis   3. Mold exposure   4. Perennial allergic rhinitis     1.  Allergen avoidance measures  2.  Eliminate household water infiltration and dry up house with dehumidifier  3.  Continue Topamax for at least 6 months and eliminate all caffeine consumption  4.  Contact clinic of recurrent nosebleeds in the future  5.  Further evaluation treatment?  6.  Obtain fall flu vaccine (and Covid vaccine)  Fortunately appears as though Krista Sandoval has pretty good control of her migraine headaches now that she has started Topamax.  I have encouraged her to consolidate all of her caffeine consumption as this will no doubt result in better control of her headaches.  She was interested in stopping her Topamax because she is doing so much better and I have encouraged her to use Topamax for least 6 months and then make a decision about whether or not she can come off this medication.  She will contact me regarding recurrent episodes of epistaxis but fortunately this issue appears to have resolved at this point.  She will contact me should she have any respiratory tract symptoms that appear to remain after she rectifies the moisture infiltration issue inside the household which hopefully will be completed next week.  Jiles Prows, MD Allergy / Immunology [facility]

## 2019-09-22 ENCOUNTER — Encounter (HOSPITAL_COMMUNITY): Payer: Self-pay

## 2019-09-22 ENCOUNTER — Emergency Department (HOSPITAL_COMMUNITY): Payer: Medicaid Other

## 2019-09-22 ENCOUNTER — Other Ambulatory Visit: Payer: Self-pay

## 2019-09-22 ENCOUNTER — Emergency Department (HOSPITAL_COMMUNITY)
Admission: EM | Admit: 2019-09-22 | Discharge: 2019-09-22 | Disposition: A | Discharge status: Home / Self Care | Payer: Medicaid Other | Attending: Emergency Medicine | Admitting: Emergency Medicine

## 2019-09-22 ENCOUNTER — Encounter: Payer: Self-pay | Admitting: Allergy and Immunology

## 2019-09-22 DIAGNOSIS — D252 Subserosal leiomyoma of uterus: Secondary | ICD-10-CM | POA: Diagnosis not present

## 2019-09-22 DIAGNOSIS — J45909 Unspecified asthma, uncomplicated: Secondary | ICD-10-CM | POA: Insufficient documentation

## 2019-09-22 DIAGNOSIS — Z87891 Personal history of nicotine dependence: Secondary | ICD-10-CM | POA: Insufficient documentation

## 2019-09-22 DIAGNOSIS — Z79899 Other long term (current) drug therapy: Secondary | ICD-10-CM | POA: Diagnosis not present

## 2019-09-22 DIAGNOSIS — R1032 Left lower quadrant pain: Secondary | ICD-10-CM | POA: Diagnosis not present

## 2019-09-22 DIAGNOSIS — B9689 Other specified bacterial agents as the cause of diseases classified elsewhere: Secondary | ICD-10-CM | POA: Insufficient documentation

## 2019-09-22 DIAGNOSIS — N760 Acute vaginitis: Secondary | ICD-10-CM | POA: Diagnosis not present

## 2019-09-22 DIAGNOSIS — R1030 Lower abdominal pain, unspecified: Secondary | ICD-10-CM | POA: Diagnosis present

## 2019-09-22 DIAGNOSIS — R109 Unspecified abdominal pain: Secondary | ICD-10-CM

## 2019-09-22 LAB — URINALYSIS, ROUTINE W REFLEX MICROSCOPIC
Bilirubin Urine: NEGATIVE
Glucose, UA: NEGATIVE mg/dL
Hgb urine dipstick: NEGATIVE
Ketones, ur: NEGATIVE mg/dL
Leukocytes,Ua: NEGATIVE
Nitrite: NEGATIVE
Protein, ur: NEGATIVE mg/dL
Specific Gravity, Urine: 1.017 (ref 1.005–1.030)
pH: 6 (ref 5.0–8.0)

## 2019-09-22 LAB — COMPREHENSIVE METABOLIC PANEL
ALT: 15 U/L (ref 0–44)
AST: 17 U/L (ref 15–41)
Albumin: 4.1 g/dL (ref 3.5–5.0)
Alkaline Phosphatase: 74 U/L (ref 38–126)
Anion gap: 8 (ref 5–15)
BUN: 12 mg/dL (ref 6–20)
CO2: 26 mmol/L (ref 22–32)
Calcium: 9.4 mg/dL (ref 8.9–10.3)
Chloride: 102 mmol/L (ref 98–111)
Creatinine, Ser: 0.73 mg/dL (ref 0.44–1.00)
GFR calc Af Amer: >60 mL/min (ref >60)
GFR calc non Af Amer: >60 mL/min (ref >60)
Glucose, Bld: 93 mg/dL (ref 70–99)
Potassium: 4.2 mmol/L (ref 3.5–5.1)
Sodium: 136 mmol/L (ref 135–145)
Total Bilirubin: 0.6 mg/dL (ref 0.3–1.2)
Total Protein: 7 g/dL (ref 6.5–8.1)

## 2019-09-22 LAB — CBC WITH DIFFERENTIAL/PLATELET
Abs Immature Granulocytes: 0.02 10*3/uL (ref 0.00–0.07)
Basophils Absolute: 0.1 10*3/uL (ref 0.0–0.1)
Basophils Relative: 1 %
Eosinophils Absolute: 0.3 10*3/uL (ref 0.0–0.5)
Eosinophils Relative: 4 %
HCT: 41.2 % (ref 36.0–46.0)
Hemoglobin: 13.4 g/dL (ref 12.0–15.0)
Immature Granulocytes: 0 %
Lymphocytes Relative: 30 %
Lymphs Abs: 2.3 10*3/uL (ref 0.7–4.0)
MCH: 30.6 pg (ref 26.0–34.0)
MCHC: 32.5 g/dL (ref 30.0–36.0)
MCV: 94.1 fL (ref 80.0–100.0)
Monocytes Absolute: 0.5 10*3/uL (ref 0.1–1.0)
Monocytes Relative: 7 %
Neutro Abs: 4.3 10*3/uL (ref 1.7–7.7)
Neutrophils Relative %: 58 %
Platelets: 299 10*3/uL (ref 150–400)
RBC: 4.38 MIL/uL (ref 3.87–5.11)
RDW: 12.3 % (ref 11.5–15.5)
WBC: 7.4 10*3/uL (ref 4.0–10.5)
nRBC: 0 % (ref 0.0–0.2)

## 2019-09-22 LAB — WET PREP, GENITAL
Sperm: NONE SEEN
Trich, Wet Prep: NONE SEEN
Yeast Wet Prep HPF POC: NONE SEEN

## 2019-09-22 LAB — PREGNANCY, URINE: Preg Test, Ur: NEGATIVE

## 2019-09-22 MED ORDER — METRONIDAZOLE 500 MG PO TABS: 500.0000 mg | ORAL_TABLET | Freq: Two times a day (BID) | ORAL | 0 refills | Status: AC

## 2019-09-22 MED ORDER — CYCLOBENZAPRINE HCL 10 MG PO TABS: 10.0000 mg | ORAL_TABLET | Freq: Two times a day (BID) | ORAL | 0 refills | Status: AC | PRN

## 2019-09-22 MED ORDER — KETOROLAC TROMETHAMINE 30 MG/ML IJ SOLN
30.0000 mg | Freq: Once | INTRAMUSCULAR | Status: AC
  Administered 2019-09-22: 19:00:00 30 mg via INTRAVENOUS
  Filled 2019-09-22: qty 1

## 2019-09-22 MED ORDER — SODIUM CHLORIDE 0.9 % IV BOLUS
1000.0000 mL | Freq: Once | INTRAVENOUS | Status: AC
  Administered 2019-09-22: 19:00:00 1000 mL via INTRAVENOUS

## 2019-09-22 MED ORDER — ONDANSETRON HCL 4 MG/2ML IJ SOLN
4.0000 mg | Freq: Once | INTRAMUSCULAR | Status: AC
  Administered 2019-09-22: 19:00:00 4 mg via INTRAVENOUS
  Filled 2019-09-22: qty 2

## 2019-09-22 MED ORDER — CYCLOBENZAPRINE HCL 10 MG PO TABS: 10.0000 mg | ORAL_TABLET | Freq: Two times a day (BID) | ORAL | 0 refills | Status: DC | PRN

## 2019-09-22 MED ORDER — METRONIDAZOLE 500 MG PO TABS: 500.0000 mg | ORAL_TABLET | Freq: Two times a day (BID) | ORAL | 0 refills | Status: DC

## 2019-09-22 NOTE — ED Notes (Signed)
Patient transported to CT 

## 2019-09-22 NOTE — ED Triage Notes (Signed)
Pt c/o left sided flank pain radiating down to groin. Pt states that she has nausea.

## 2019-09-22 NOTE — ED Provider Notes (Addendum)
[facility] DEPT Provider Note   CSN: [id number] Arrival date & time: 09/22/19  1415     History   Chief Complaint Chief Complaint  Patient presents with  . Flank Pain    HPI Krista Sandoval is a 43 y.o. female.     HPI  Left flank pain radiating to suprapubic area, severe pain, burning sensation, sharp pains, severe never had anything like it Started last night suddenly at 930PM then improved and this AM was mild then worsened throughout the day Nausea, no vomiting No diarrhea or constipation No fevers Chills No vaginal discharge or bleeding   Past Medical History:  Diagnosis Date  . Asthma     There are no active problems to display for this patient.   Past Surgical History:  Procedure Laterality Date  . NO PAST SURGERIES       OB History    Gravida  2   Para  1   Term  1   Preterm      AB      Living  1     SAB      TAB      Ectopic      Multiple      Live Births               Home Medications    Prior to Admission medications   Medication Sig Start Date End Date Taking? Authorizing Provider  ibuprofen (ADVIL,MOTRIN) 600 MG tablet Take 1 tablet (600 mg total) by mouth every 6 (six) hours as needed. 09/24/18  Yes McDonald, Mia A, PA-C  Multiple Vitamins-Minerals (MULTIVITAMIN PO) Take 1 tablet by mouth daily. Prenatal vitamin   Yes [provider]  SUMAtriptan (IMITREX) 50 MG tablet Take 50 mg by mouth 3 (three) times daily as needed. 08/16/19  Yes [provider]  topiramate (TOPAMAX) 25 MG tablet Take 25 mg by mouth daily.   Yes [provider]  cyclobenzaprine (FLEXERIL) 10 MG tablet Take 1 tablet (10 mg total) by mouth 2 (two) times daily as needed for muscle spasms. 09/22/19   Gareth Morgan, MD  metroNIDAZOLE (FLAGYL) 500 MG tablet Take 1 tablet (500 mg total) by mouth 2 (two) times daily for 7 days. 09/22/19 09/29/19  Gareth Morgan, MD    Family History Family  History  Problem Relation Age of Onset  . Hypertension Mother   . COPD Mother   . Hyperlipidemia Mother   . Asthma Father     Social History Social History   Tobacco Use  . Smoking status: Former Smoker    Types: Cigarettes    Quit date: 04/20/2017    Years since quitting: 2.4  . Smokeless tobacco: Never Used  Substance Use Topics  . Alcohol use: No  . Drug use: No     Allergies   Amoxicillin   Review of Systems Review of Systems  Constitutional: Negative for fever.  HENT: Negative for sore throat.   Eyes: Negative for visual disturbance.  Respiratory: Negative for cough and shortness of breath.   Cardiovascular: Negative for chest pain.  Gastrointestinal: Positive for abdominal pain and nausea. Negative for constipation, diarrhea and vomiting.  Genitourinary: Positive for flank pain. Negative for difficulty urinating, dysuria, vaginal bleeding and vaginal discharge.  Musculoskeletal: Negative for back pain and neck pain.  Skin: Negative for rash.  Neurological: Negative for syncope and headaches.     Physical Exam Updated Vital Signs BP 122/77   Pulse (!) 58  Temp 98.7 F (37.1 C) (Oral)   Resp 16   Wt 74.4 kg   SpO2 97%   BMI 28.15 kg/m   Physical Exam Vitals signs and nursing note reviewed.  Constitutional:      General: She is not in acute distress.    Appearance: She is well-developed. She is not diaphoretic.  HENT:     Head: Normocephalic and atraumatic.  Eyes:     Conjunctiva/sclera: Conjunctivae normal.  Neck:     Musculoskeletal: Normal range of motion.  Cardiovascular:     Rate and Rhythm: Normal rate and regular rhythm.     Heart sounds: Normal heart sounds. No murmur. No friction rub. No gallop.   Pulmonary:     Effort: Pulmonary effort is normal. No respiratory distress.     Breath sounds: Normal breath sounds. No wheezing or rales.  Abdominal:     General: There is no distension.     Palpations: Abdomen is soft.     Tenderness:  There is abdominal tenderness in the left lower quadrant. There is no guarding.  Genitourinary:    Cervix: Discharge present. No cervical motion tenderness.     Uterus: Not tender.      Adnexa:        Right: No tenderness.         Left: Tenderness present.   Musculoskeletal:        General: No tenderness.  Skin:    General: Skin is warm and dry.     Findings: No erythema or rash.  Neurological:     Mental Status: She is alert and oriented to person, place, and time.      ED Treatments / Results  Labs (all labs ordered are listed, but only abnormal results are displayed) Labs Reviewed  WET PREP, GENITAL - Abnormal; Notable for the following components:      Result Value   Clue Cells Wet Prep HPF POC PRESENT (*)    WBC, Wet Prep HPF POC FEW (*)    All other components within normal limits  URINALYSIS, ROUTINE W REFLEX MICROSCOPIC  PREGNANCY, URINE  CBC WITH DIFFERENTIAL/PLATELET  COMPREHENSIVE METABOLIC PANEL  GC/CHLAMYDIA PROBE AMP ([place]) NOT AT [facility]    EKG None  Radiology Ct Renal Stone Study  Result Date: 09/22/2019 CLINICAL DATA:  Initial evaluation for acute left-sided flank pain. EXAM: CT ABDOMEN AND PELVIS WITHOUT CONTRAST TECHNIQUE: Multidetector CT imaging of the abdomen and pelvis was performed following the standard protocol without IV contrast. COMPARISON:  Prior CT from 11/03/2018. FINDINGS: Lower chest: Visualized lung bases are clear. Hepatobiliary: Limited noncontrast evaluation of the liver is unremarkable. Gallbladder within normal limits. No biliary dilatation. Pancreas: Pancreas within normal limits. Spleen: Spleen within normal limits. Adrenals/Urinary Tract: Adrenal glands are normal. Kidneys equal in size without evidence for nephrolithiasis or hydronephrosis. No radiopaque calculi seen along the course of either renal collecting system. No hydroureter. Partially distended bladder within normal limits. No layering stones within the bladder lumen.  Stomach/Bowel: Stomach within normal limits. No evidence for bowel obstruction. Normal appendix. Colonic diverticulosis without evidence for acute diverticulitis. No acute inflammatory changes seen about the bowels. Vascular/Lymphatic: Mild aorto bi-iliac atherosclerotic disease. No aneurysm. No adenopathy. Reproductive: Fibroid uterus noted. 2.1 cm simple left ovarian cyst noted, likely a physiologic cyst. Right ovary within normal limits. Other: No free air or fluid. Musculoskeletal: No acute osseous abnormality. No discrete lytic or blastic osseous lesions. IMPRESSION: 1. No CT evidence for nephrolithiasis or obstructive uropathy. 2. No  other acute intra-abdominal or pelvic process. 3. Colonic diverticulosis without evidence for acute diverticulitis. 4. Fibroid uterus. 5. 2.1 cm simple left ovarian cyst, likely a normal physiologic follicular cyst. Electronically Signed   By: Jeannine Boga M.D.   On: 09/22/2019 20:10   US Pelvic Complete W Transvaginal And Torsion R/o  Result Date: 09/22/2019 CLINICAL DATA:  44 year old female with left lower quadrant abdominal pain EXAM: TRANSABDOMINAL AND TRANSVAGINAL ULTRASOUND OF PELVIS DOPPLER ULTRASOUND OF OVARIES TECHNIQUE: Both transabdominal and transvaginal ultrasound examinations of the pelvis were performed. Transabdominal technique was performed for global imaging of the pelvis including uterus, ovaries, adnexal regions, and pelvic cul-de-sac. It was necessary to proceed with endovaginal exam following the transabdominal exam to visualize the endometrium and ovaries. Color and duplex Doppler ultrasound was utilized to evaluate blood flow to the ovaries. COMPARISON:  CT of the abdomen pelvis dated 09/22/2019 FINDINGS: Uterus Measurements: 8.7 x 4.8 x 5.5 cm = volume: 119 mL. The uterus is anteverted. The uterus is heterogeneous. There is a 2.6 x 1.6 x 1.4 cm posterior body subserosal fibroid. Endometrium Thickness: 3 mm.  The visualized endometrium appear  unremarkable. Right ovary Measurements: 2.9 x 1.5 x 1.4 cm = volume: 3.3 mL. Normal appearance/no adnexal mass. Left ovary Measurements: 2.9 x 2.7 x 2.6 cm = volume: 10.7 mL. Normal appearance/no adnexal mass. Pulsed Doppler evaluation of both ovaries demonstrates normal low-resistance arterial and venous waveforms. Other findings No abnormal free fluid. IMPRESSION: 1. Heterogeneous uterus with a posterior body subserosal fibroid. 2. Unremarkable endometrium and ovaries. Electronically Signed   By: Anner Crete M.D.   On: 09/22/2019 22:05    Procedures Procedures (including critical care time)  Medications Ordered in ED Medications  ketorolac (TORADOL) 30 MG/ML injection 30 mg (30 mg Intravenous Given 09/22/19 1855)  sodium chloride 0.9 % bolus 1,000 mL (0 mLs Intravenous Stopped 09/22/19 2035)  ondansetron (ZOFRAN) injection 4 mg (4 mg Intravenous Given 09/22/19 1855)     Initial Impression / Assessment and Plan / ED Course  I have reviewed the triage vital signs and the nursing notes.  Pertinent labs & imaging results that were available during my care of the patient were reviewed by me and considered in my medical decision making (see chart for details).        43yo female with history of asthma, diverticulitis, presents with concern for left lower quadrant and left flank pain.  CT stone study without signs of stones or diverticulitis. TV US with doppler done showing no evidence of torsion, shows normal ovaries.  Uterus heterogeneous, fibroid present. Recommend GYN follow up.  No CMT, no uterine tenderness, doubt PID.  BV present, given rx for flagyl.  Possible pain could be musculoskeletal, also consider shingles without rash at this time although no hyperalgesia.  Recommend supportive care, gave rx for flexeril, recommend PCP and GYN follow up.   Final Clinical Impressions(s) / ED Diagnoses   Final diagnoses:  Left lower quadrant pain  Left flank pain  Bacterial vaginosis   Fibroids, subserous    ED Discharge Orders         Ordered    metroNIDAZOLE (FLAGYL) 500 MG tablet  2 times daily,   Status:  Discontinued     09/22/19 2320    cyclobenzaprine (FLEXERIL) 10 MG tablet  2 times daily PRN,   Status:  Discontinued     09/22/19 2320    cyclobenzaprine (FLEXERIL) 10 MG tablet  2 times daily PRN     09/22/19 2331  metroNIDAZOLE (FLAGYL) 500 MG tablet  2 times daily     09/22/19 2331           Gareth Morgan, MD 09/23/19 2204    Gareth Morgan, MD 09/23/19 2206

## 2019-09-22 NOTE — ED Notes (Signed)
Pt ambulatory to restroom without assistance 

## 2019-09-22 NOTE — ED Notes (Signed)
Pt ambulatory from triage 

## 2019-09-24 LAB — GC/CHLAMYDIA PROBE AMP (~~LOC~~) NOT AT ARMC
Chlamydia: NEGATIVE
Neisseria Gonorrhea: NEGATIVE

## 2019-10-14 ENCOUNTER — Encounter: Payer: Self-pay | Admitting: Neurology

## 2019-10-14 ENCOUNTER — Ambulatory Visit: Payer: Medicaid Other | Admitting: Neurology

## 2019-10-14 ENCOUNTER — Telehealth: Payer: Self-pay | Admitting: *Deleted

## 2019-10-14 NOTE — Telephone Encounter (Signed)
Canceled new pt appt same day.  Called our office and stated she had a scheduling conflict.

## 2020-01-13 ENCOUNTER — Ambulatory Visit: Payer: Medicaid Other | Admitting: Neurology

## 2020-01-13 ENCOUNTER — Telehealth: Payer: Self-pay | Admitting: *Deleted

## 2020-01-13 NOTE — Telephone Encounter (Signed)
I attempted to reach both the patient and husband multiple times. Unable to reach either and both voicemail boxes were full.  She is not set up for mychart. No one else listed as an emergency contact and no one else listed on DPR.

## 2020-01-13 NOTE — Telephone Encounter (Signed)
After three calls to each number, I tried to reach out to the patient again. This time it did not ring but went straight to vm. It had been cleared and I was able to leave a message letting her know our office is closed today. I provided our number to call back to be rescheduled.

## 2020-09-27 IMAGING — CT CT ABD-PELV W/ CM
2 series · 16 of 42 positions shown, 19 images · IV contrast (iopamidol)
Comparison: CT, 04/29/2018

CLINICAL DATA: Right lower quadrant pain beginning this morning.

EXAM:
CT ABDOMEN AND PELVIS WITH CONTRAST
TECHNIQUE: Multidetector CT imaging of the abdomen and pelvis was performed
using the standard protocol following bolus administration of
intravenous contrast.
CONTRAST:  100mL HVPKHA-PGG IOPAMIDOL (HVPKHA-PGG) INJECTION 61%

[Series 3: a/p w/ 5mm · axial · 0.82mm/px · z∈[+595,+1035]mm · 13 of 99 slices shown, 16 images]
[im 7/99  soft-tissue]
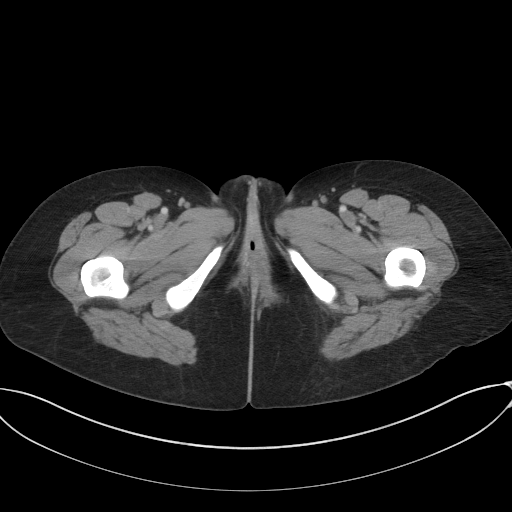
[im 7/99  bone]
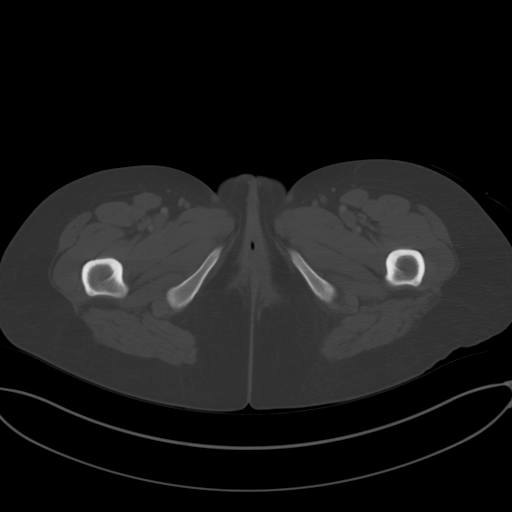
[im 16/99  soft-tissue]
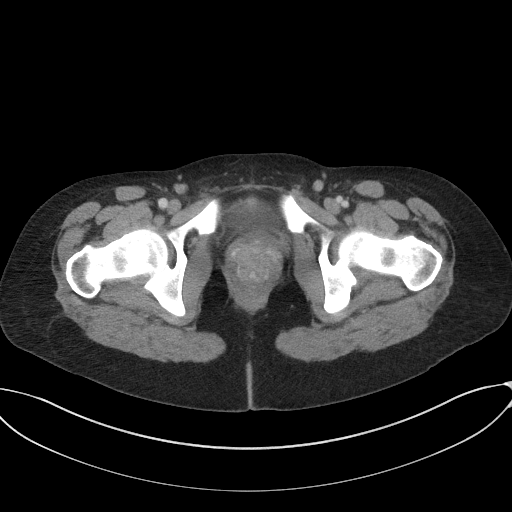
[im 26/99  soft-tissue]
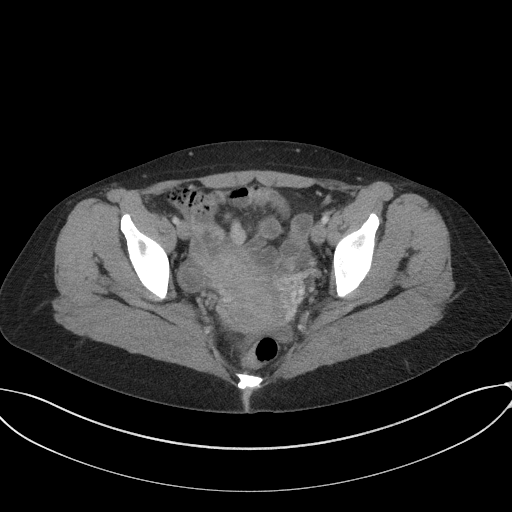
[im 35/99  soft-tissue]
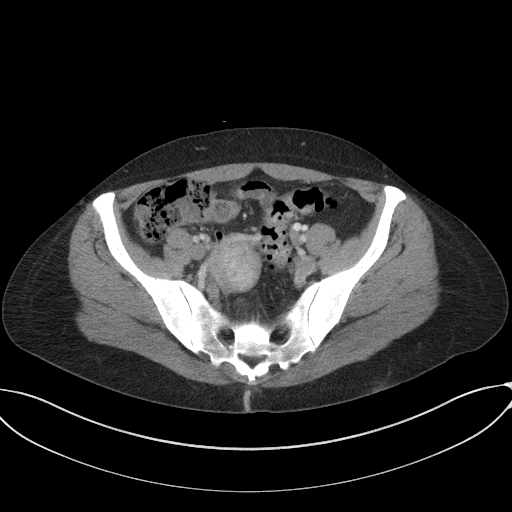
[im 45/99  soft-tissue]
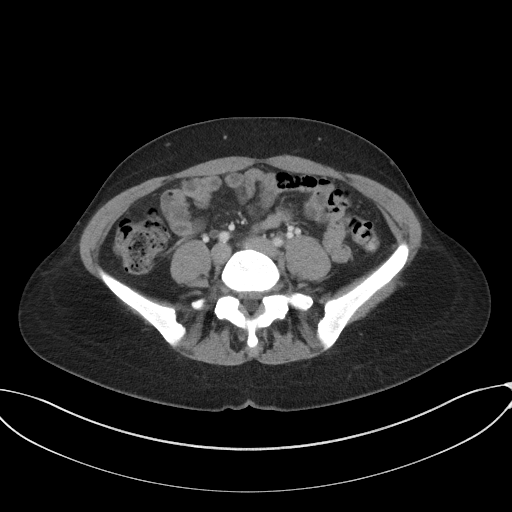
[im 54/99  soft-tissue]
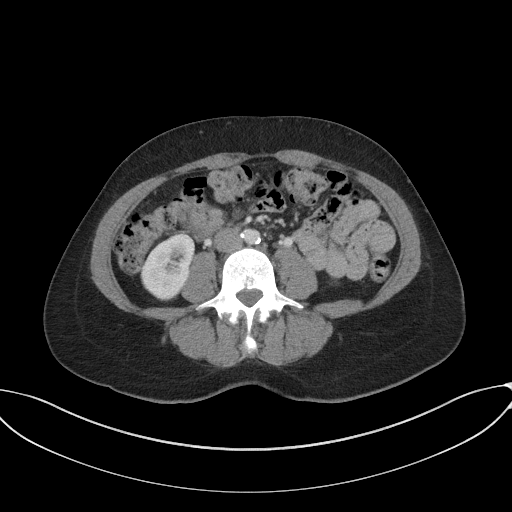
[im 64/99  soft-tissue]
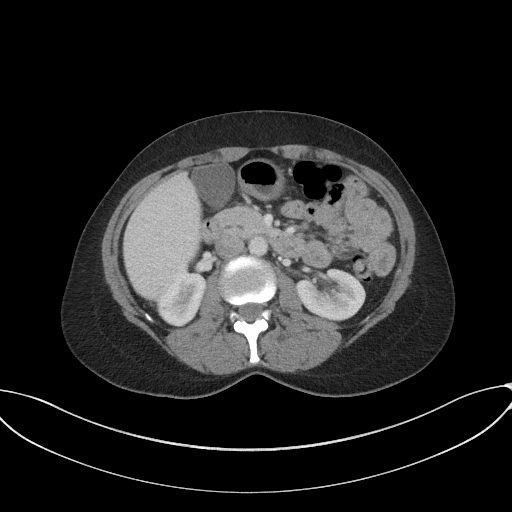
[im 73/99  soft-tissue]
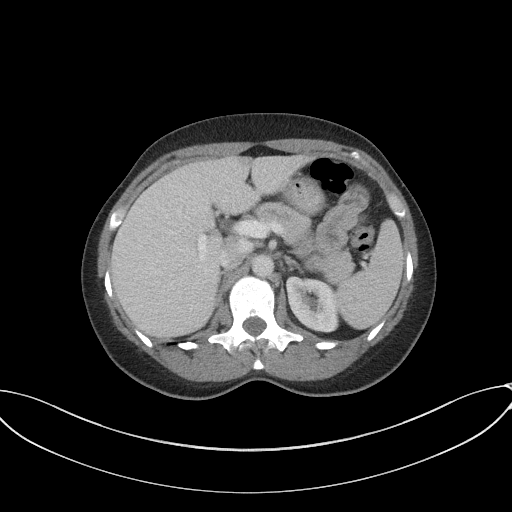
[im 83/99  soft-tissue]
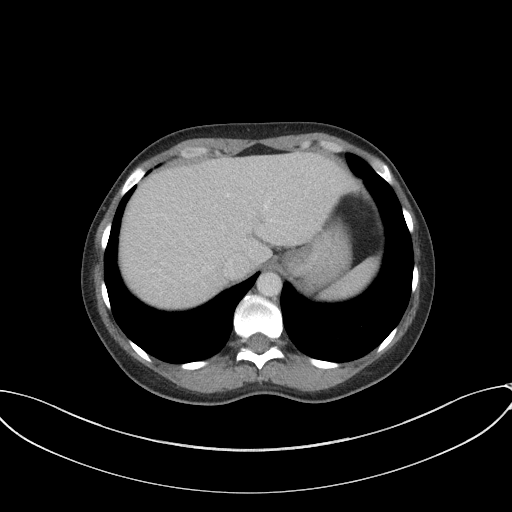
[im 83/99  bone]
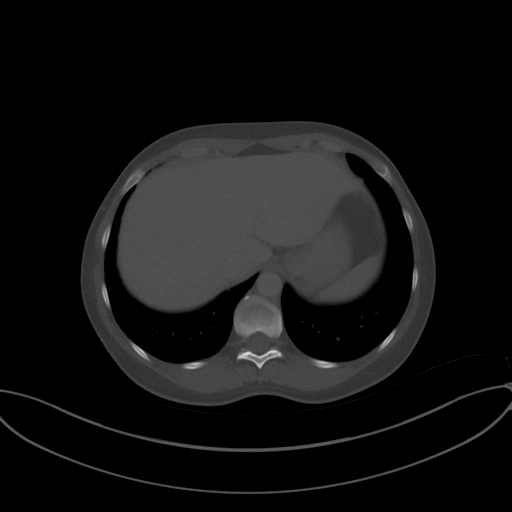
[im 86/99  lung]
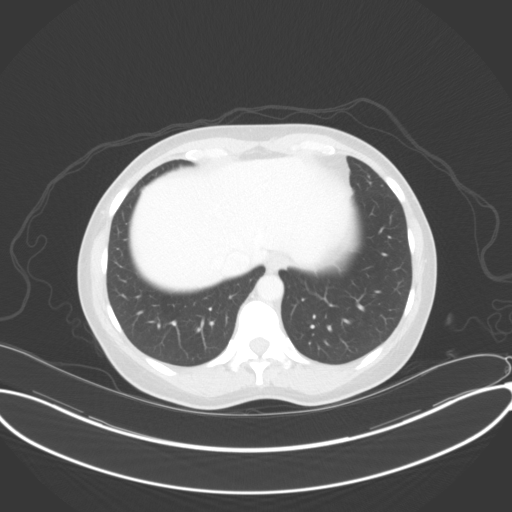
[im 89/99  lung]
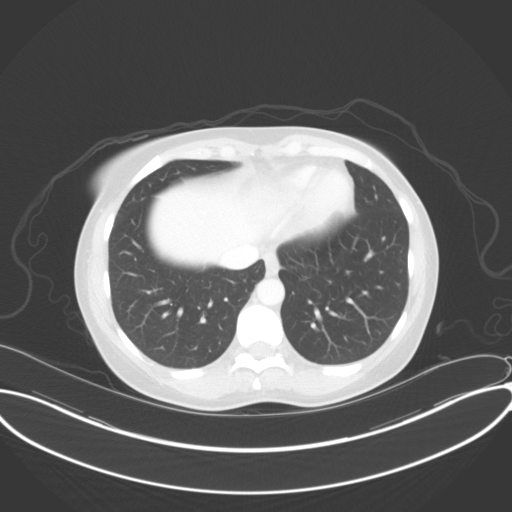
[im 92/99  soft-tissue]
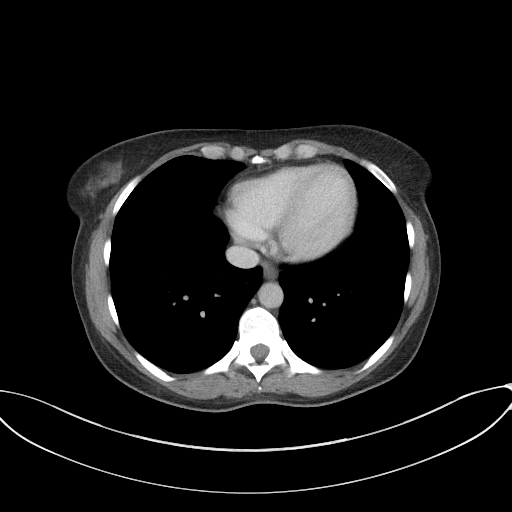
[im 92/99  lung]
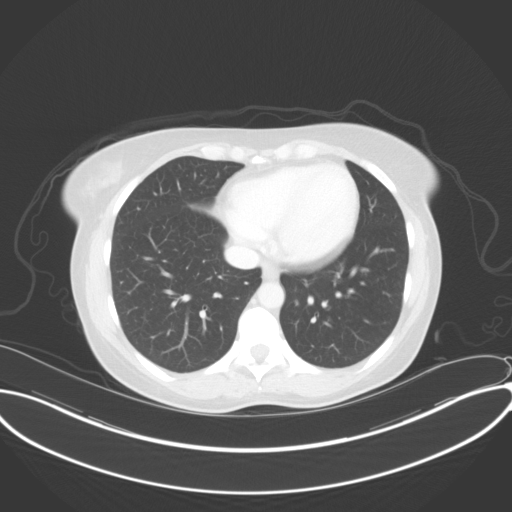
[im 95/99  lung]
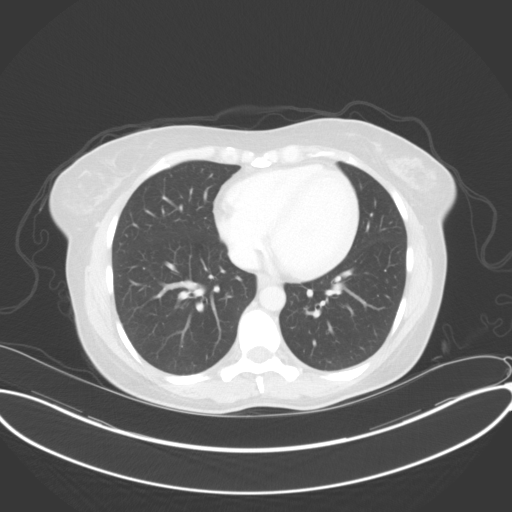

[Series 6: a/p w/ cor · coronal · 0.89mm/px · 3 of 151 slices shown]
[im 51/151  soft-tissue]
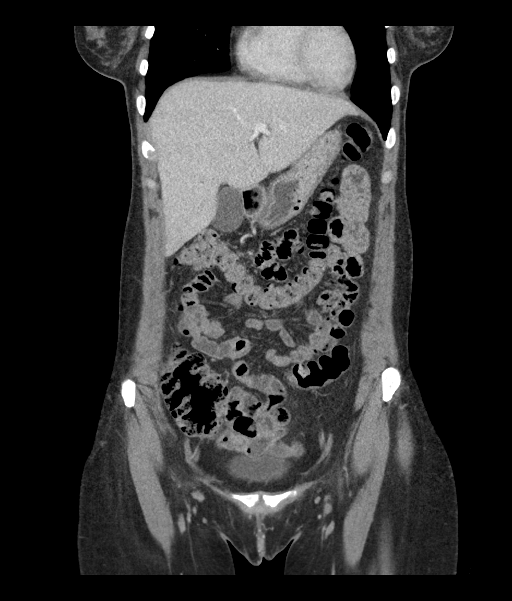
[im 67/151  soft-tissue]
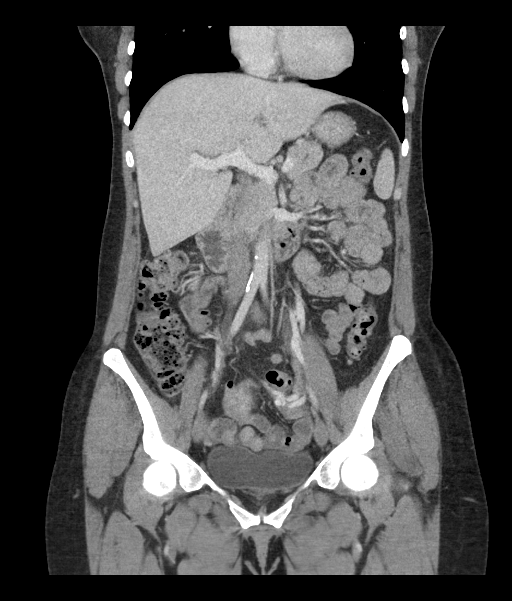
[im 84/151  soft-tissue]
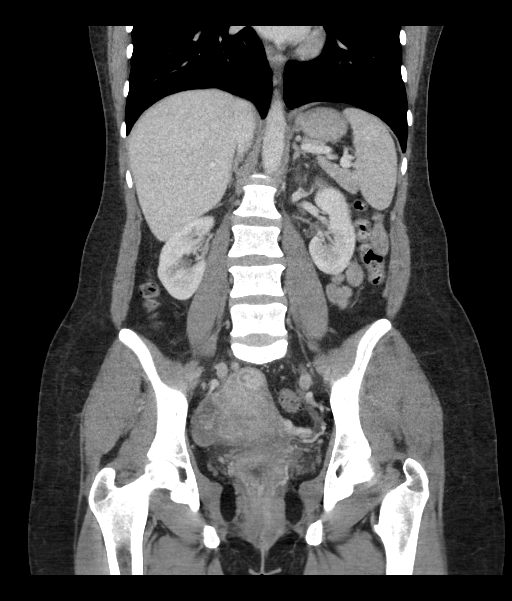

[16 of 42 positions shown; findings below may reference images not displayed]

FINDINGS: Lower chest: Clear lung bases.  Heart normal in size.

Hepatobiliary: No focal liver abnormality is seen. No gallstones,
gallbladder wall thickening, or biliary dilatation.

Pancreas: Unremarkable. No pancreatic ductal dilatation or
surrounding inflammatory changes.

Spleen: Normal in size without focal abnormality.

Adrenals/Urinary Tract: Adrenal glands are unremarkable. Kidneys are
normal, without renal calculi, focal lesion, or hydronephrosis.
Bladder is unremarkable.

Stomach/Bowel: Normal appendix visualized.

Stomach, small bowel and colon are within normal limits.

Vascular/Lymphatic: Mild aortic atherosclerosis. No aneurysm. No
adenopathy.

Reproductive: Contour bulge from the posterior upper uterine segment
and fundus consistent with an approximately 3.2 cm subserosal
fibroid. Uterus otherwise unremarkable. No adnexal masses.

Other: No abdominal wall hernia or abnormality. No abdominopelvic
ascites.

Musculoskeletal: No acute or significant osseous findings.
IMPRESSION: 1. No acute findings. Normal appendix visualized. No findings to
account for right lower quadrant pain.
2. 3.2 cm uterine fibroid.  No other abnormalities.

## 2022-10-10 NOTE — [facility] (Signed)
Does not have glasses yet.  Has appt. Will try to use Medicaid.  Encouraged to try to use.  Out of school until January.  Came in to get voucher for [place] give away.  Vinnie Langton, RN, [place] Nurse, [phone].

## 2023-02-11 ENCOUNTER — Emergency Department (HOSPITAL_BASED_OUTPATIENT_CLINIC_OR_DEPARTMENT_OTHER)
Admission: EM | Admit: 2023-02-11 | Discharge: 2023-02-11 | Disposition: A | Discharge status: Home / Self Care | Payer: Commercial Managed Care - HMO | Attending: Emergency Medicine | Admitting: Emergency Medicine

## 2023-02-11 ENCOUNTER — Other Ambulatory Visit: Payer: Self-pay

## 2023-02-11 ENCOUNTER — Emergency Department (HOSPITAL_BASED_OUTPATIENT_CLINIC_OR_DEPARTMENT_OTHER): Payer: Commercial Managed Care - HMO

## 2023-02-11 ENCOUNTER — Other Ambulatory Visit (HOSPITAL_BASED_OUTPATIENT_CLINIC_OR_DEPARTMENT_OTHER): Payer: Self-pay

## 2023-02-11 ENCOUNTER — Encounter (HOSPITAL_BASED_OUTPATIENT_CLINIC_OR_DEPARTMENT_OTHER): Payer: Self-pay

## 2023-02-11 DIAGNOSIS — R748 Abnormal levels of other serum enzymes: Secondary | ICD-10-CM | POA: Diagnosis not present

## 2023-02-11 DIAGNOSIS — R8289 Other abnormal findings on cytological and histological examination of urine: Secondary | ICD-10-CM | POA: Insufficient documentation

## 2023-02-11 DIAGNOSIS — R109 Unspecified abdominal pain: Secondary | ICD-10-CM | POA: Diagnosis present

## 2023-02-11 DIAGNOSIS — R112 Nausea with vomiting, unspecified: Secondary | ICD-10-CM | POA: Diagnosis not present

## 2023-02-11 DIAGNOSIS — J45909 Unspecified asthma, uncomplicated: Secondary | ICD-10-CM | POA: Diagnosis not present

## 2023-02-11 DIAGNOSIS — R197 Diarrhea, unspecified: Secondary | ICD-10-CM | POA: Insufficient documentation

## 2023-02-11 DIAGNOSIS — R1032 Left lower quadrant pain: Secondary | ICD-10-CM | POA: Diagnosis not present

## 2023-02-11 LAB — URINALYSIS, ROUTINE W REFLEX MICROSCOPIC
Bilirubin Urine: NEGATIVE
Glucose, UA: NEGATIVE mg/dL
Hgb urine dipstick: NEGATIVE
Ketones, ur: NEGATIVE mg/dL
Nitrite: NEGATIVE
Specific Gravity, Urine: 1.029 (ref 1.005–1.030)
pH: 5.5 (ref 5.0–8.0)

## 2023-02-11 LAB — CBC
HCT: 38.9 % (ref 36.0–46.0)
Hemoglobin: 13 g/dL (ref 12.0–15.0)
MCH: 31 pg (ref 26.0–34.0)
MCHC: 33.4 g/dL (ref 30.0–36.0)
MCV: 92.6 fL (ref 80.0–100.0)
Platelets: 322 10*3/uL (ref 150–400)
RBC: 4.2 MIL/uL (ref 3.87–5.11)
RDW: 12.7 % (ref 11.5–15.5)
WBC: 6.8 10*3/uL (ref 4.0–10.5)
nRBC: 0 % (ref 0.0–0.2)

## 2023-02-11 LAB — COMPREHENSIVE METABOLIC PANEL
ALT: 16 U/L (ref 0–44)
AST: 17 U/L (ref 15–41)
Albumin: 4.3 g/dL (ref 3.5–5.0)
Alkaline Phosphatase: 73 U/L (ref 38–126)
Anion gap: 6 (ref 5–15)
BUN: 9 mg/dL (ref 6–20)
CO2: 28 mmol/L (ref 22–32)
Calcium: 9.4 mg/dL (ref 8.9–10.3)
Chloride: 102 mmol/L (ref 98–111)
Creatinine, Ser: 0.71 mg/dL (ref 0.44–1.00)
GFR, Estimated: >60 mL/min (ref >60)
Glucose, Bld: 91 mg/dL (ref 70–99)
Potassium: 3.7 mmol/L (ref 3.5–5.1)
Sodium: 136 mmol/L (ref 135–145)
Total Bilirubin: 0.7 mg/dL (ref 0.3–1.2)
Total Protein: 7 g/dL (ref 6.5–8.1)

## 2023-02-11 LAB — PREGNANCY, URINE: Preg Test, Ur: NEGATIVE

## 2023-02-11 LAB — LIPASE, BLOOD: Lipase: 146 U/L — ABNORMAL HIGH (ref 11–51)

## 2023-02-11 MED ORDER — SODIUM CHLORIDE 0.9 % IV BOLUS
1000.0000 mL | Freq: Once | INTRAVENOUS | Status: AC
  Administered 2023-02-11: 1000 mL via INTRAVENOUS

## 2023-02-11 MED ORDER — DICYCLOMINE HCL 20 MG PO TABS
20.0000 mg | ORAL_TABLET | Freq: Two times a day (BID) | ORAL | 0 refills | Status: AC | PRN
  Filled 2023-02-11: qty 20, 10d supply, fill #0

## 2023-02-11 MED ORDER — ONDANSETRON HCL 4 MG PO TABS
4.0000 mg | ORAL_TABLET | Freq: Four times a day (QID) | ORAL | 0 refills | Status: AC
  Filled 2023-02-11: qty 12, 3d supply, fill #0

## 2023-02-11 MED ORDER — IOHEXOL 300 MG/ML  SOLN
100.0000 mL | Freq: Once | INTRAMUSCULAR | Status: AC | PRN
  Administered 2023-02-11: 80 mL via INTRAVENOUS

## 2023-02-11 MED ORDER — CEFADROXIL 500 MG PO CAPS
500.0000 mg | ORAL_CAPSULE | Freq: Two times a day (BID) | ORAL | 0 refills | Status: DC
  Filled 2023-02-11: qty 10, 5d supply, fill #0

## 2023-02-11 MED ORDER — MORPHINE SULFATE (PF) 4 MG/ML IV SOLN
4.0000 mg | Freq: Once | INTRAVENOUS | Status: AC
  Administered 2023-02-11: 4 mg via INTRAVENOUS
  Filled 2023-02-11: qty 1

## 2023-02-11 MED ORDER — ONDANSETRON HCL 4 MG/2ML IJ SOLN
4.0000 mg | Freq: Once | INTRAMUSCULAR | Status: AC
  Administered 2023-02-11: 4 mg via INTRAVENOUS
  Filled 2023-02-11: qty 2

## 2023-02-11 NOTE — ED Triage Notes (Signed)
Patient here POV from Home.  Endorses Left Sided Flank Pain that began yesterday. Became somewhat better and than increased today.  No Dysuria. No Hematuria. Some nausea. 1 Episode of Emesis yesterday. Some Loose Stools.   NAD Noted during Triage. A&Ox4. GCS 15. Ambulatory.

## 2023-02-11 NOTE — ED Provider Notes (Signed)
[facility] Provider Note   CSN: [id number] Arrival date & time: 02/11/23  1415     History  Chief Complaint  Patient presents with   Flank Pain    Krista Sandoval is a 47 y.o. female.   Flank Pain   47 year old female presents emergency department with complaints of abdominal pain.  Patient reports left-sided abdominal pain that began yesterday insidiously.  Patient reports worsening of symptoms today.  Reports feelings of nausea with an episode of emesis yesterday.  Denies fever, chills, night sweats, urinary symptoms, vaginal symptoms.  Patient is also reported some diarrhea without evidence of hematochezia/melena.  Denies history of abdominal surgeries.  Past medical history significant for asthma  Home Medications Prior to Admission medications   Medication Sig Start Date End Date Taking? Authorizing Provider  cefadroxil (DURICEF) 500 MG capsule Take 1 capsule (500 mg total) by mouth 2 (two) times daily. 02/11/23  Yes Dion Saucier A, PA  dicyclomine (BENTYL) 20 MG tablet Take 1 tablet (20 mg total) by mouth 2 (two) times daily as needed for spasms. 02/11/23  Yes Dion Saucier A, PA  fluticasone (FLONASE) 50 MCG/ACT nasal spray Place 1 spray into both nostrils daily. 01/25/23  Yes [provider]  ondansetron (ZOFRAN) 4 MG tablet Take 1 tablet (4 mg total) by mouth every 6 (six) hours. 02/11/23  Yes Dion Saucier A, PA  cyclobenzaprine (FLEXERIL) 10 MG tablet Take 1 tablet (10 mg total) by mouth 2 (two) times daily as needed for muscle spasms. 09/22/19   Gareth Morgan, MD  ibuprofen (ADVIL,MOTRIN) 600 MG tablet Take 1 tablet (600 mg total) by mouth every 6 (six) hours as needed. 09/24/18   McDonald, Mia A, PA-C  Multiple Vitamins-Minerals (MULTIVITAMIN PO) Take 1 tablet by mouth daily. Prenatal vitamin    [provider]  SUMAtriptan (IMITREX) 50 MG tablet Take 50 mg by mouth 3 (three) times daily as needed. 08/16/19    [provider]  topiramate (TOPAMAX) 25 MG tablet Take 25 mg by mouth daily.    [provider]      Allergies    Amoxicillin    Review of Systems   Review of Systems  Genitourinary:  Positive for flank pain.  All other systems reviewed and are negative.   Physical Exam Updated Vital Signs BP 118/78 (BP Location: Right Arm)   Pulse 72   Temp 98.1 F (36.7 C) (Oral)   Resp 18   Ht 5\' 4"  (1.626 m)   Wt 68 kg   SpO2 99%   BMI 25.75 kg/m  Physical Exam Vitals and nursing note reviewed.  Constitutional:      General: She is not in acute distress.    Appearance: She is well-developed.  HENT:     Head: Normocephalic and atraumatic.  Eyes:     Conjunctiva/sclera: Conjunctivae normal.  Cardiovascular:     Rate and Rhythm: Normal rate and regular rhythm.     Heart sounds: No murmur heard. Pulmonary:     Effort: Pulmonary effort is normal. No respiratory distress.     Breath sounds: Normal breath sounds.  Abdominal:     Palpations: Abdomen is soft.     Tenderness: There is abdominal tenderness. There is no right CVA tenderness, left CVA tenderness or guarding.     Comments: Left lower quadrant as well as suprapubic tenderness to palpation.  Musculoskeletal:        General: No swelling.     Cervical back:  Neck supple.     Right lower leg: No edema.     Left lower leg: No edema.  Skin:    General: Skin is warm and dry.     Capillary Refill: Capillary refill takes less than 2 seconds.  Neurological:     Mental Status: She is alert.  Psychiatric:        Mood and Affect: Mood normal.     ED Results / Procedures / Treatments   Labs (all labs ordered are listed, but only abnormal results are displayed) Labs Reviewed  LIPASE, BLOOD - Abnormal; Notable for the following components:      Result Value   Lipase 146 (*)    All other components within normal limits  URINALYSIS, ROUTINE W REFLEX MICROSCOPIC - Abnormal; Notable for the following  components:   Protein, ur TRACE (*)    Leukocytes,Ua SMALL (*)    Bacteria, UA RARE (*)    All other components within normal limits  COMPREHENSIVE METABOLIC PANEL  CBC  PREGNANCY, URINE    EKG None  Radiology CT ABDOMEN PELVIS W CONTRAST  Result Date: 02/11/2023 CLINICAL DATA:  Left flank pain. EXAM: CT ABDOMEN AND PELVIS WITH CONTRAST TECHNIQUE: Multidetector CT imaging of the abdomen and pelvis was performed using the standard protocol following bolus administration of intravenous contrast. RADIATION DOSE REDUCTION: This exam was performed according to the departmental dose-optimization program which includes automated exposure control, adjustment of the mA and/or kV according to patient size and/or use of iterative reconstruction technique. CONTRAST:  75mL OMNIPAQUE IOHEXOL 300 MG/ML  SOLN COMPARISON:  July 04, 2022 FINDINGS: Lower chest: No acute abnormality. Hepatobiliary: No focal liver abnormality is seen. No gallstones, gallbladder wall thickening, or biliary dilatation. Pancreas: Unremarkable. No pancreatic ductal dilatation or surrounding inflammatory changes. Spleen: Normal in size without focal abnormality. Adrenals/Urinary Tract: Adrenal glands are unremarkable. Kidneys are normal, without renal calculi, focal lesion, or hydronephrosis. Urinary bladder is poorly distended and subsequently limited in evaluation. Stomach/Bowel: Stomach is within normal limits. Appendix appears normal. No evidence of bowel wall thickening, distention, or inflammatory changes. Noninflamed diverticula are seen within the descending and sigmoid colon. Vascular/Lymphatic: Aortic atherosclerosis. No enlarged abdominal or pelvic lymph nodes. Reproductive: A 3 cm diameter heterogeneous uterine fibroid is seen within the anterior aspect of the uterine fundus on the left. A 2.2 cm x 1.9 cm cyst is seen within the right adnexa. The left adnexa is unremarkable. Other: No abdominal wall hernia or abnormality. No  abdominopelvic ascites. Musculoskeletal: No acute or significant osseous findings. IMPRESSION: 1. Colonic diverticulosis. 2. Stable uterine fibroid. 3. Small right adnexal cyst, likely ovarian in origin. No follow-up imaging is recommended. This recommendation follows ACR consensus guidelines: White Paper of the ACR Incidental Findings Committee II on Adnexal Findings. J Am Coll Radiol 2013:10:675-681. 4. Aortic atherosclerosis. Aortic Atherosclerosis (ICD10-I70.0). Electronically Signed   By: Virgina Norfolk M.D.   On: 02/11/2023 15:45    Procedures Procedures    Medications Ordered in ED Medications  sodium chloride 0.9 % bolus 1,000 mL (1,000 mLs Intravenous New Bag/Given 02/11/23 1515)  ondansetron (ZOFRAN) injection 4 mg (4 mg Intravenous Given 02/11/23 1516)  morphine (PF) 4 MG/ML injection 4 mg (4 mg Intravenous Given 02/11/23 1518)  iohexol (OMNIPAQUE) 300 MG/ML solution 100 mL (80 mLs Intravenous Contrast Given 02/11/23 1525)    ED Course/ Medical Decision Making/ A&P Clinical Course as of 02/11/23 1636  Tue Feb 11, 2023  1503 Yest left pain Diarrhea yest  [CR]  1612  CT ABDOMEN PELVIS W CONTRAST [CR]    Clinical Course User Index [CR] Wilnette Kales, PA                             Medical Decision Making Amount and/or Complexity of Data Reviewed Labs: ordered. Radiology: ordered.  Risk Prescription drug management.   This patient presents to the ED for concern of abdominal pain, this involves an extensive number of treatment options, and is a complaint that carries with it a high risk of complications and morbidity.  The differential diagnosis includes pancreatitis, gastritis, PUD/duodenal ulcer disease, CBD pathology, cholecystitis, AAA, aortic dissection, nephrolithiasis, pyelonephritis, cystitis, SBO/LBO, diverticulitis, appendicitis, ectopic pregnancy, ovarian torsion, PID, peritonitis   Co morbidities that complicate the patient evaluation  See  HPI   Additional history obtained:  Additional history obtained from EMR External records from outside source obtained and reviewed including hospital records   Lab Tests:  I Ordered, and personally interpreted labs.  The pertinent results include: No leukocytosis noted.  No evidence anemia.  Platelets within normal range.  No electrolyte abnormalities appreciated.  No renal dysfunction.  No transaminitis.  Urine pregnancy negative.  Lipase slightly elevated at 146 without CT findings concerning for inflammation of pancreatitis or physical exam findings of epigastric tenderness to palpation.  UA dirty sample with 11-20 squamous epithelial cells but with bacteria, leukocyte and trace proteins present.   Imaging Studies ordered:  I ordered imaging studies including CT abdomen pelvis I independently visualized and interpreted imaging which showed colonic diverticulosis.  Stable uterine fibroid.  Small right adnexal cyst.  Aortic atherosclerosis. I agree with the radiologist interpretation   Cardiac Monitoring: / EKG:  The patient was maintained on a cardiac monitor.  I personally viewed and interpreted the cardiac monitored which showed an underlying rhythm of: Sinus rhythm   Consultations Obtained:  N/a   Problem List / ED Course / Critical interventions / Medication management  Abdominal pain I ordered medication including 1 L normal saline, Zofran, morphine f  Reevaluation of the patient after these medicines showed that the patient improved I have reviewed the patients home medicines and have made adjustments as needed   Social Determinants of Health:  Former cigarette use.  Denies illicit drug use.   Test / Admission - Considered:  Abdominal pain Vitals signs within normal range and stable throughout visit. Laboratory/imaging studies significant for: See above Patient with overall reassuring workup.  Unsure of exactly what is causing patient's abdominal pain.   Patient overall well-appearing, afebrile, no leukocytosis and no acute distress.  CT imaging negative for any acute abnormality attributing to patient's lower left abdominal pain.  Patient noted resolution as of symptoms with administration of medicines while in the emergency department.  Will continue outpatient medical therapy in the form of Bentyl to take as needed, Zofran as needed for nausea/vomiting.  Urine dirty sample but did not show signs of leukocytes and bacteria so we will treat empirically for cystitis.  Patient recommended follow-up with primary care for reassessment of symptoms.  Treatment plan discussed at length with patient and she acknowledged understanding was agreeable to said plan. Worrisome signs and symptoms were discussed with the patient, and the patient acknowledged understanding to return to the ED if noticed. Patient was stable upon discharge.          Final Clinical Impression(s) / ED Diagnoses Final diagnoses:  Left lower quadrant abdominal pain    Rx /  DC Orders ED Discharge Orders          Ordered    cefadroxil (DURICEF) 500 MG capsule  2 times daily        02/11/23 1559    ondansetron (ZOFRAN) 4 MG tablet  Every 6 hours        02/11/23 1613    dicyclomine (BENTYL) 20 MG tablet  2 times daily PRN        02/11/23 1613              Wilnette Kales, PA 02/11/23 1636    Jeanell Sparrow, DO 02/12/23 1537

## 2023-02-11 NOTE — Discharge Instructions (Addendum)
Note that your workup was overall reassuring. No acute abnormality found on CT imaging of the abdomen or pelvis.  It did show diverticulosis without diverticulitis, uterine fibroid, right-sided small ovarian cyst of which these are most likely not causing her symptoms.  Recommend Zofran as needed for nausea, Bentyl for abdominal discomfort.  As discussed, we will treat for presumed urinary tract infection with antibiotics.  Recommend follow-up with primary care provider for reassessment of your symptoms.  Please do not hesitate to return to emergency department for worrisome signs and symptoms we discussed become apparent.

## 2023-02-12 ENCOUNTER — Emergency Department (HOSPITAL_BASED_OUTPATIENT_CLINIC_OR_DEPARTMENT_OTHER)
Admission: EM | Admit: 2023-02-12 | Discharge: 2023-02-13 | Disposition: A | Discharge status: Home / Self Care | Payer: Commercial Managed Care - HMO | Attending: Emergency Medicine | Admitting: Emergency Medicine

## 2023-02-12 ENCOUNTER — Other Ambulatory Visit: Payer: Self-pay

## 2023-02-12 ENCOUNTER — Encounter: Payer: Self-pay | Admitting: Physician Assistant

## 2023-02-12 DIAGNOSIS — R109 Unspecified abdominal pain: Secondary | ICD-10-CM | POA: Diagnosis present

## 2023-02-12 DIAGNOSIS — R1084 Generalized abdominal pain: Secondary | ICD-10-CM | POA: Diagnosis not present

## 2023-02-12 DIAGNOSIS — M545 Low back pain, unspecified: Secondary | ICD-10-CM | POA: Diagnosis not present

## 2023-02-12 MED ORDER — FENTANYL CITRATE PF 50 MCG/ML IJ SOSY
50.0000 ug | PREFILLED_SYRINGE | Freq: Once | INTRAMUSCULAR | Status: AC
  Administered 2023-02-13: 50 ug via INTRAVENOUS
  Filled 2023-02-12: qty 1

## 2023-02-12 MED ORDER — SODIUM CHLORIDE 0.9 % IV BOLUS
1000.0000 mL | Freq: Once | INTRAVENOUS | Status: AC
  Administered 2023-02-13: 1000 mL via INTRAVENOUS

## 2023-02-12 MED ORDER — KETOROLAC TROMETHAMINE 30 MG/ML IJ SOLN
30.0000 mg | Freq: Once | INTRAMUSCULAR | Status: DC
  Filled 2023-02-12: qty 1

## 2023-02-12 NOTE — ED Provider Notes (Signed)
[facility]  Provider Note  CSN: [id number] Arrival date & time: 02/12/23 2257  History Chief Complaint  Patient presents with   Abdominal Pain    Krista Sandoval is a 47 y.o. female seen in the ED yesterday for L flank pain, had mostly negative workup including CBC, CMP. Lipase was mildly elevated. UA was contaminated with no signs of infection and she was not having any urinary symptoms. CT was neg for cause of her pain. She was treated with pain medications, muscle relaxer. Abx for presumed cystitis. She reports pain is worse today. No vomiting, no fever. No dysuria, hematuria or pyuria. No diarrhea or constipation.    Home Medications Prior to Admission medications   Medication Sig Start Date End Date Taking? Authorizing Provider  cefadroxil (DURICEF) 500 MG capsule Take 1 capsule (500 mg total) by mouth 2 (two) times daily. 02/11/23   Wilnette Kales, PA  cyclobenzaprine (FLEXERIL) 10 MG tablet Take 1 tablet (10 mg total) by mouth 2 (two) times daily as needed for muscle spasms. 09/22/19   Gareth Morgan, MD  dicyclomine (BENTYL) 20 MG tablet Take 1 tablet (20 mg total) by mouth 2 (two) times daily as needed for spasms. 02/11/23   Dion Saucier A, PA  fluticasone (FLONASE) 50 MCG/ACT nasal spray Place 1 spray into both nostrils daily. 01/25/23   [provider]  ibuprofen (ADVIL,MOTRIN) 600 MG tablet Take 1 tablet (600 mg total) by mouth every 6 (six) hours as needed. 09/24/18   McDonald, Mia A, PA-C  Multiple Vitamins-Minerals (MULTIVITAMIN PO) Take 1 tablet by mouth daily. Prenatal vitamin    [provider]  ondansetron (ZOFRAN) 4 MG tablet Take 1 tablet (4 mg total) by mouth every 6 (six) hours. 02/11/23   Wilnette Kales, PA  SUMAtriptan (IMITREX) 50 MG tablet Take 50 mg by mouth 3 (three) times daily as needed. 08/16/19   [provider]  topiramate (TOPAMAX) 25 MG tablet Take 25 mg by mouth daily.     [provider]     Allergies    Amoxicillin   Review of Systems   Review of Systems Please see HPI for pertinent positives and negatives  Physical Exam BP 123/68   Pulse 67   Temp 97.9 F (36.6 C) (Oral)   Resp 16   SpO2 100%   Physical Exam Vitals and nursing note reviewed.  Constitutional:      Appearance: Normal appearance.  HENT:     Head: Normocephalic and atraumatic.     Nose: Nose normal.     Mouth/Throat:     Mouth: Mucous membranes are moist.  Eyes:     Extraocular Movements: Extraocular movements intact.     Conjunctiva/sclera: Conjunctivae normal.  Cardiovascular:     Rate and Rhythm: Normal rate.  Pulmonary:     Effort: Pulmonary effort is normal.     Breath sounds: Normal breath sounds.  Abdominal:     General: Abdomen is flat.     Palpations: Abdomen is soft.     Tenderness: There is generalized abdominal tenderness (worse in L side).  Musculoskeletal:        General: Tenderness (L lower back) present. No swelling. Normal range of motion.     Cervical back: Neck supple.  Skin:    General: Skin is warm and dry.  Neurological:     General: No focal deficit present.     Mental Status: She is alert.  Psychiatric:  Mood and Affect: Mood normal.     ED Results / Procedures / Treatments   EKG None  Procedures Procedures  Medications Ordered in the ED Medications  ketorolac (TORADOL) 30 MG/ML injection 30 mg (has no administration in time range)    Initial Impression and Plan  Patient here with worsening flank and abdominal pain. Workup yesterday was negative. She did not have any signs of infection in her urine and no reported urinary symptoms, so will likely not need continued Abx. Will recheck labs today to ensure no progression of elevated lipase to suggest pancreatitis. Pain/nausea meds and IVF for comfort.   ED Course       MDM Rules/Calculators/A&P Medical Decision Making Risk Prescription drug  management.     Final Clinical Impression(s) / ED Diagnoses Final diagnoses:  None    Rx / DC Orders ED Discharge Orders     None

## 2023-02-12 NOTE — ED Triage Notes (Signed)
Arrives POV from home. Aox4. Seen yesterday for similar abd pain- left flank pain. Denies CP and SOB.  No dysuria. +N/-V.  Returning for worsening pain despite prescription meds.

## 2023-02-13 DIAGNOSIS — R1084 Generalized abdominal pain: Secondary | ICD-10-CM | POA: Diagnosis not present

## 2023-02-13 LAB — CBC WITH DIFFERENTIAL/PLATELET
Abs Immature Granulocytes: 0.03 10*3/uL (ref 0.00–0.07)
Basophils Absolute: 0 10*3/uL (ref 0.0–0.1)
Basophils Relative: 0 %
Eosinophils Absolute: 0.2 10*3/uL (ref 0.0–0.5)
Eosinophils Relative: 2 %
HCT: 34.8 % — ABNORMAL LOW (ref 36.0–46.0)
Hemoglobin: 11.7 g/dL — ABNORMAL LOW (ref 12.0–15.0)
Immature Granulocytes: 0 %
Lymphocytes Relative: 39 %
Lymphs Abs: 2.9 10*3/uL (ref 0.7–4.0)
MCH: 31 pg (ref 26.0–34.0)
MCHC: 33.6 g/dL (ref 30.0–36.0)
MCV: 92.1 fL (ref 80.0–100.0)
Monocytes Absolute: 0.6 10*3/uL (ref 0.1–1.0)
Monocytes Relative: 8 %
Neutro Abs: 3.7 10*3/uL (ref 1.7–7.7)
Neutrophils Relative %: 51 %
Platelets: 295 10*3/uL (ref 150–400)
RBC: 3.78 MIL/uL — ABNORMAL LOW (ref 3.87–5.11)
RDW: 12.7 % (ref 11.5–15.5)
WBC: 7.4 10*3/uL (ref 4.0–10.5)
nRBC: 0 % (ref 0.0–0.2)

## 2023-02-13 LAB — COMPREHENSIVE METABOLIC PANEL
ALT: 15 U/L (ref 0–44)
AST: 14 U/L — ABNORMAL LOW (ref 15–41)
Albumin: 3.9 g/dL (ref 3.5–5.0)
Alkaline Phosphatase: 68 U/L (ref 38–126)
Anion gap: 3 — ABNORMAL LOW (ref 5–15)
BUN: 12 mg/dL (ref 6–20)
CO2: 32 mmol/L (ref 22–32)
Calcium: 10 mg/dL (ref 8.9–10.3)
Chloride: 102 mmol/L (ref 98–111)
Creatinine, Ser: 0.79 mg/dL (ref 0.44–1.00)
GFR, Estimated: >60 mL/min (ref >60)
Glucose, Bld: 84 mg/dL (ref 70–99)
Potassium: 3.8 mmol/L (ref 3.5–5.1)
Sodium: 137 mmol/L (ref 135–145)
Total Bilirubin: 0.3 mg/dL (ref 0.3–1.2)
Total Protein: 6.5 g/dL (ref 6.5–8.1)

## 2023-02-13 LAB — URINALYSIS, ROUTINE W REFLEX MICROSCOPIC
Bilirubin Urine: NEGATIVE
Glucose, UA: NEGATIVE mg/dL
Hgb urine dipstick: NEGATIVE
Ketones, ur: NEGATIVE mg/dL
Leukocytes,Ua: NEGATIVE
Nitrite: NEGATIVE
Protein, ur: NEGATIVE mg/dL
Specific Gravity, Urine: 1.009 (ref 1.005–1.030)
pH: 5.5 (ref 5.0–8.0)

## 2023-02-13 LAB — LIPASE, BLOOD: Lipase: 38 U/L (ref 11–51)

## 2023-02-13 MED ORDER — HYDROCODONE-ACETAMINOPHEN 5-325 MG PO TABS: 1.0000 | ORAL_TABLET | Freq: Four times a day (QID) | ORAL | 0 refills | Status: AC | PRN

## 2023-02-13 MED ORDER — KETOROLAC TROMETHAMINE 30 MG/ML IJ SOLN
30.0000 mg | Freq: Once | INTRAMUSCULAR | Status: AC
  Administered 2023-02-13: 30 mg via INTRAVENOUS

## 2023-03-27 ENCOUNTER — Ambulatory Visit: Payer: Commercial Managed Care - HMO | Admitting: Physician Assistant

## 2023-06-02 NOTE — Progress Notes (Deleted)
06/02/2023 Krista Sandoval [id number] 25-Feb-1976  Referring provider: Buckner Malta, NP Primary GI doctor: {acdocs:[id number]}  ASSESSMENT AND PLAN:   There are no diagnoses linked to this encounter.   Patient Care Team: Buckner Malta, NP as PCP - General (Nurse Practitioner) Lucie Leather Alvira Philips, MD as Consulting Physician (Allergy and Immunology)  HISTORY OF PRESENT ILLNESS: 47 y.o. female with a past medical history of migraines and others listed below presents for evaluation of ***.   Patient {Actions; denies-reports:120008} family history of colon cancer or other gastrointestinal malignancies. Recent ER visit 02/11/2023 for left-sided abdominal pain.  Patient had unremarkable CBC, kidney liver, urine without nitrates, lipase slightly elevated at 146 CT abdomen pelvis with contrast showed colonic diverticulosis, stable uterine fibroid, small right adnexal likely ovarian in origin cyst, aortic atherosclerosis.  Normal pancreas, liver, normal gallbladder, normal stomach and bowel. 3/20 patient return to the ER with ongoing left flank pain, repeat labs show Hgb 11.7, MCV 92.1  She {Actions; denies-reports:120008} blood thinner use.  She {Actions; denies-reports:120008} NSAID use.  She {Actions; denies-reports:120008} ETOH use.   She {Actions; denies-reports:120008} tobacco use.  She {Actions; denies-reports:120008} drug use.    She  reports that she quit smoking about 6 years ago. Her smoking use included cigarettes. She has never used smokeless tobacco. She reports that she does not drink alcohol and does not use drugs.  RELEVANT LABS AND IMAGING: CBC    Component Value Date/Time   WBC 7.4 02/12/2023 2352   RBC 3.78 (L) 02/12/2023 2352   HGB 11.7 (L) 02/12/2023 2352   HCT 34.8 (L) 02/12/2023 2352   PLT 295 02/12/2023 2352   MCV 92.1 02/12/2023 2352   MCH 31.0 02/12/2023 2352   MCHC 33.6 02/12/2023 2352   RDW 12.7 02/12/2023 2352   LYMPHSABS 2.9 02/12/2023 2352   MONOABS 0.6  02/12/2023 2352   EOSABS 0.2 02/12/2023 2352   BASOSABS 0.0 02/12/2023 2352   Recent Labs    02/11/23 1425 02/12/23 2352  HGB 13.0 11.7*    CMP     Component Value Date/Time   NA 137 02/12/2023 2352   K 3.8 02/12/2023 2352   CL 102 02/12/2023 2352   CO2 32 02/12/2023 2352   GLUCOSE 84 02/12/2023 2352   BUN 12 02/12/2023 2352   CREATININE 0.79 02/12/2023 2352   CALCIUM 10.0 02/12/2023 2352   PROT 6.5 02/12/2023 2352   ALBUMIN 3.9 02/12/2023 2352   AST 14 (L) 02/12/2023 2352   ALT 15 02/12/2023 2352   ALKPHOS 68 02/12/2023 2352   BILITOT 0.3 02/12/2023 2352   GFRNONAA >60 02/12/2023 2352   GFRAA >60 09/22/2019 1813      Latest Ref Rng & Units 02/12/2023   11:52 PM 02/11/2023    2:25 PM 09/22/2019    6:13 PM  Hepatic Function  Total Protein 6.5 - 8.1 g/dL 6.5  7.0  7.0   Albumin 3.5 - 5.0 g/dL 3.9  4.3  4.1   AST 15 - 41 U/L 14  17  17    ALT 0 - 44 U/L 15  16  15    Alk Phosphatase 38 - 126 U/L 68  73  74   Total Bilirubin 0.3 - 1.2 mg/dL 0.3  0.7  0.6       Current Medications:     Current Outpatient Medications (Respiratory):    levocetirizine (XYZAL) 5 MG tablet, Take 5 mg by mouth every evening.   fluticasone (FLONASE) 50 MCG/ACT nasal spray, Place 1 spray into  both nostrils daily.  Current Outpatient Medications (Analgesics):    HYDROcodone-acetaminophen (NORCO/VICODIN) 5-325 MG tablet, Take 1 tablet by mouth every 6 (six) hours as needed for severe pain.   ibuprofen (ADVIL,MOTRIN) 600 MG tablet, Take 1 tablet (600 mg total) by mouth every 6 (six) hours as needed.   SUMAtriptan (IMITREX) 50 MG tablet, Take 50 mg by mouth 3 (three) times daily as needed.   Current Outpatient Medications (Other):    cyclobenzaprine (FLEXERIL) 10 MG tablet, Take 1 tablet (10 mg total) by mouth 2 (two) times daily as needed for muscle spasms.   dicyclomine (BENTYL) 20 MG tablet, Take 1 tablet (20 mg total) by mouth 2 (two) times daily as needed for spasms.   Multiple  Vitamins-Minerals (MULTIVITAMIN PO), Take 1 tablet by mouth daily. Prenatal vitamin   ondansetron (ZOFRAN) 4 MG tablet, Take 1 tablet (4 mg total) by mouth every 6 (six) hours.   topiramate (TOPAMAX) 25 MG tablet, Take 25 mg by mouth daily.  Medical History:  Past Medical History:  Diagnosis Date   Asthma    Allergies:  Allergies  Allergen Reactions   Amoxicillin Shortness Of Breath     Surgical History:  She  has a past surgical history that includes No past surgeries. Family History:  Her family history includes Asthma in her father; COPD in her mother; Hyperlipidemia in her mother; Hypertension in her mother.  REVIEW OF SYSTEMS  : All other systems reviewed and negative except where noted in the History of Present Illness.  PHYSICAL EXAM: There were no vitals taken for this visit. General Appearance: Well nourished, in no apparent distress. Head:   Normocephalic and atraumatic. Eyes:  sclerae anicteric,conjunctive pink  Respiratory: Respiratory effort normal, BS equal bilaterally without rales, rhonchi, wheezing. Cardio: RRR with no MRGs. Peripheral pulses intact.  Abdomen: Soft,  {BlankSingle:19197::"Flat","Obese","Non-distended"} ,active bowel sounds. {actendernessAB:27319} tenderness {anatomy; site abdomen:5010}. {BlankMultiple:19196::"Without guarding","With guarding","Without rebound","With rebound"}. No masses. Rectal: {acrectalexam:27461} Musculoskeletal: Full ROM, {PSY - GAIT AND STATION:22860} gait. {With/Without:304960234} edema. Skin:  Dry and intact without significant lesions or rashes Neuro: Alert and  oriented x4;  No focal deficits. Psych:  Cooperative. Normal mood and affect.    Krista Albee, PA-C 11:59 AM

## 2023-06-03 ENCOUNTER — Ambulatory Visit: Payer: Commercial Managed Care - HMO | Admitting: Physician Assistant

## 2023-06-05 ENCOUNTER — Ambulatory Visit: Payer: Commercial Managed Care - HMO | Admitting: Gastroenterology

## 2023-06-05 NOTE — Progress Notes (Deleted)
Chief Complaint: Primary GI MD: Gentry Fitz  HPI: 47 year old female with history of asthma presents for evaluation of.  Seen at [facility] emergency department 01/2023 for left flank pain.  CBC, CMP was negative.  Lipase mildly elevated (146) that improved to 38 the following day.  UA negative (dirty sample but no signs of leukocytes or bacteria).  CT abdomen pelvis with contrast showed normal gallbladder without gallstones or wall thickening.  Unremarkable pancreas without inflammatory changes.  Diverticulosis without diverticulitis.  Able uterine fibroid and small right adnexal cyst.  Patient was discharged with Bentyl and Zofran and was empirically treated for cystitis with cefadroxil.     PREVIOUS GI WORKUP   No previous colonoscopies  Past Medical History:  Diagnosis Date   Asthma     Past Surgical History:  Procedure Laterality Date   NO PAST SURGERIES      Current Outpatient Medications  Medication Sig Dispense Refill   cyclobenzaprine (FLEXERIL) 10 MG tablet Take 1 tablet (10 mg total) by mouth 2 (two) times daily as needed for muscle spasms. 20 tablet 0   dicyclomine (BENTYL) 20 MG tablet Take 1 tablet (20 mg total) by mouth 2 (two) times daily as needed for spasms. 20 tablet 0   fluticasone (FLONASE) 50 MCG/ACT nasal spray Place 1 spray into both nostrils daily.     HYDROcodone-acetaminophen (NORCO/VICODIN) 5-325 MG tablet Take 1 tablet by mouth every 6 (six) hours as needed for severe pain. 12 tablet 0   ibuprofen (ADVIL,MOTRIN) 600 MG tablet Take 1 tablet (600 mg total) by mouth every 6 (six) hours as needed. 30 tablet 0   levocetirizine (XYZAL) 5 MG tablet Take 5 mg by mouth every evening.     Multiple Vitamins-Minerals (MULTIVITAMIN PO) Take 1 tablet by mouth daily. Prenatal vitamin     ondansetron (ZOFRAN) 4 MG tablet Take 1 tablet (4 mg total) by mouth every 6 (six) hours. 12 tablet 0   SUMAtriptan (IMITREX) 50 MG tablet Take 50 mg by mouth 3 (three) times  daily as needed.     topiramate (TOPAMAX) 25 MG tablet Take 25 mg by mouth daily.     No current facility-administered medications for this visit.    Allergies as of 06/05/2023 - Review Complete 02/12/2023  Allergen Reaction Noted   Amoxicillin Shortness Of Breath 04/16/2017    Family History  Problem Relation Age of Onset   Hypertension Mother    COPD Mother    Hyperlipidemia Mother    Asthma Father     Social History   Socioeconomic History   Marital status: Single    Spouse name: Not on file   Number of children: Not on file   Years of education: Not on file   Highest education level: Not on file  Occupational History   Not on file  Tobacco Use   Smoking status: Former    Current packs/day: 0.00    Types: Cigarettes    Quit date: 04/20/2017    Years since quitting: 6.1   Smokeless tobacco: Never  Vaping Use   Vaping status: Never Used  Substance and Sexual Activity   Alcohol use: No   Drug use: No   Sexual activity: Yes  Other Topics Concern   Not on file  Social History Narrative   Not on file   Social Determinants of Health   Financial Resource Strain: Not on file  Food Insecurity: Not on file  Transportation Needs: Not on file  Physical Activity: Not on file  Stress: Not on file  Social Connections: Not on file  Intimate Partner Violence: Not on file    Review of Systems:    Constitutional: No weight loss, fever, chills, weakness or fatigue HEENT: Eyes: No change in vision               Ears, Nose, Throat:  No change in hearing or congestion Skin: No rash or itching Cardiovascular: No chest pain, chest pressure or palpitations   Respiratory: No SOB or cough Gastrointestinal: See HPI and otherwise negative Genitourinary: No dysuria or change in urinary frequency Neurological: No headache, dizziness or syncope Musculoskeletal: No new muscle or joint pain Hematologic: No bleeding or bruising Psychiatric: No history of depression or anxiety     Physical Exam:  Vital signs: There were no vitals taken for this visit.  Constitutional: NAD, Well developed, Well nourished, alert and cooperative Head:  Normocephalic and atraumatic. Eyes:   PEERL, EOMI. No icterus. Conjunctiva pink. Respiratory: Respirations even and unlabored. Lungs clear to auscultation bilaterally.   No wheezes, crackles, or rhonchi.  Cardiovascular:  Regular rate and rhythm. No peripheral edema, cyanosis or pallor.  Gastrointestinal:  Soft, nondistended, nontender. No rebound or guarding. Normal bowel sounds. No appreciable masses or hepatomegaly. Rectal:  Not performed.  Msk:  Symmetrical without gross deformities. Without edema, no deformity or joint abnormality.  Neurologic:  Alert and  oriented x4;  grossly normal neurologically.  Skin:   Dry and intact without significant lesions or rashes. Psychiatric: Oriented to person, place and time. Demonstrates good judgement and reason without abnormal affect or behaviors.   RELEVANT LABS AND IMAGING: CBC    Component Value Date/Time   WBC 7.4 02/12/2023 2352   RBC 3.78 (L) 02/12/2023 2352   HGB 11.7 (L) 02/12/2023 2352   HCT 34.8 (L) 02/12/2023 2352   PLT 295 02/12/2023 2352   MCV 92.1 02/12/2023 2352   MCH 31.0 02/12/2023 2352   MCHC 33.6 02/12/2023 2352   RDW 12.7 02/12/2023 2352   LYMPHSABS 2.9 02/12/2023 2352   MONOABS 0.6 02/12/2023 2352   EOSABS 0.2 02/12/2023 2352   BASOSABS 0.0 02/12/2023 2352    CMP     Component Value Date/Time   NA 137 02/12/2023 2352   K 3.8 02/12/2023 2352   CL 102 02/12/2023 2352   CO2 32 02/12/2023 2352   GLUCOSE 84 02/12/2023 2352   BUN 12 02/12/2023 2352   CREATININE 0.79 02/12/2023 2352   CALCIUM 10.0 02/12/2023 2352   PROT 6.5 02/12/2023 2352   ALBUMIN 3.9 02/12/2023 2352   AST 14 (L) 02/12/2023 2352   ALT 15 02/12/2023 2352   ALKPHOS 68 02/12/2023 2352   BILITOT 0.3 02/12/2023 2352   GFRNONAA >60 02/12/2023 2352   GFRAA >60 09/22/2019 1813     Assessment: 1. ***  Plan: 1. ***     Boone Master PA-C [facility] 06/05/2023, 10:54 AM  Cc: Buckner Malta, NP

## 2024-07-28 ENCOUNTER — Encounter (HOSPITAL_BASED_OUTPATIENT_CLINIC_OR_DEPARTMENT_OTHER): Payer: Self-pay | Admitting: Emergency Medicine

## 2024-07-28 ENCOUNTER — Other Ambulatory Visit: Payer: Self-pay

## 2024-07-28 ENCOUNTER — Emergency Department (HOSPITAL_BASED_OUTPATIENT_CLINIC_OR_DEPARTMENT_OTHER)

## 2024-07-28 ENCOUNTER — Emergency Department (HOSPITAL_BASED_OUTPATIENT_CLINIC_OR_DEPARTMENT_OTHER)
Admission: EM | Admit: 2024-07-28 | Discharge: 2024-07-28 | Disposition: A | Discharge status: Home / Self Care | Attending: Emergency Medicine | Admitting: Emergency Medicine

## 2024-07-28 DIAGNOSIS — K5792 Diverticulitis of intestine, part unspecified, without perforation or abscess without bleeding: Secondary | ICD-10-CM

## 2024-07-28 DIAGNOSIS — R109 Unspecified abdominal pain: Secondary | ICD-10-CM | POA: Diagnosis present

## 2024-07-28 DIAGNOSIS — K5732 Diverticulitis of large intestine without perforation or abscess without bleeding: Secondary | ICD-10-CM | POA: Diagnosis not present

## 2024-07-28 LAB — CBC WITH DIFFERENTIAL/PLATELET
Abs Immature Granulocytes: 0.04 K/uL (ref 0.00–0.07)
Basophils Absolute: 0.1 K/uL (ref 0.0–0.1)
Basophils Relative: 1 %
Eosinophils Absolute: 0.2 K/uL (ref 0.0–0.5)
Eosinophils Relative: 2 %
HCT: 35.5 % — ABNORMAL LOW (ref 36.0–46.0)
Hemoglobin: 12.1 g/dL (ref 12.0–15.0)
Immature Granulocytes: 1 %
Lymphocytes Relative: 24 %
Lymphs Abs: 1.8 K/uL (ref 0.7–4.0)
MCH: 30.2 pg (ref 26.0–34.0)
MCHC: 34.1 g/dL (ref 30.0–36.0)
MCV: 88.5 fL (ref 80.0–100.0)
Monocytes Absolute: 0.6 K/uL (ref 0.1–1.0)
Monocytes Relative: 8 %
Neutro Abs: 4.9 K/uL (ref 1.7–7.7)
Neutrophils Relative %: 64 %
Platelets: 310 K/uL (ref 150–400)
RBC: 4.01 MIL/uL (ref 3.87–5.11)
RDW: 12.5 % (ref 11.5–15.5)
WBC: 7.4 K/uL (ref 4.0–10.5)
nRBC: 0 % (ref 0.0–0.2)

## 2024-07-28 LAB — URINALYSIS, ROUTINE W REFLEX MICROSCOPIC
Bilirubin Urine: NEGATIVE
Glucose, UA: NEGATIVE mg/dL
Hgb urine dipstick: NEGATIVE
Ketones, ur: NEGATIVE mg/dL
Nitrite: NEGATIVE
Protein, ur: NEGATIVE mg/dL
Specific Gravity, Urine: 1.015 (ref 1.005–1.030)
pH: 6 (ref 5.0–8.0)

## 2024-07-28 LAB — COMPREHENSIVE METABOLIC PANEL WITH GFR
ALT: 32 U/L (ref 0–44)
AST: 31 U/L (ref 15–41)
Albumin: 4.4 g/dL (ref 3.5–5.0)
Alkaline Phosphatase: 130 U/L — ABNORMAL HIGH (ref 38–126)
Anion gap: 13 (ref 5–15)
BUN: 12 mg/dL (ref 6–20)
CO2: 24 mmol/L (ref 22–32)
Calcium: 10.1 mg/dL (ref 8.9–10.3)
Chloride: 100 mmol/L (ref 98–111)
Creatinine, Ser: 0.81 mg/dL (ref 0.44–1.00)
GFR, Estimated: >60 mL/min (ref >60)
Glucose, Bld: 108 mg/dL — ABNORMAL HIGH (ref 70–99)
Potassium: 4.3 mmol/L (ref 3.5–5.1)
Sodium: 136 mmol/L (ref 135–145)
Total Bilirubin: 0.5 mg/dL (ref 0.0–1.2)
Total Protein: 7 g/dL (ref 6.5–8.1)

## 2024-07-28 LAB — PREGNANCY, URINE: Preg Test, Ur: NEGATIVE

## 2024-07-28 MED ORDER — CIPROFLOXACIN HCL 500 MG PO TABS: 500.0000 mg | ORAL_TABLET | Freq: Two times a day (BID) | ORAL | 0 refills | Status: AC

## 2024-07-28 MED ORDER — METRONIDAZOLE 500 MG PO TABS: 500.0000 mg | ORAL_TABLET | Freq: Two times a day (BID) | ORAL | 0 refills | Status: AC

## 2024-07-28 MED ORDER — ACETAMINOPHEN 500 MG PO TABS
1000.0000 mg | ORAL_TABLET | Freq: Once | ORAL | Status: AC
  Administered 2024-07-28: 1000 mg via ORAL
  Filled 2024-07-28: qty 2

## 2024-07-28 MED ORDER — KETOROLAC TROMETHAMINE 15 MG/ML IJ SOLN
15.0000 mg | Freq: Once | INTRAMUSCULAR | Status: AC
  Administered 2024-07-28: 15 mg via INTRAMUSCULAR
  Filled 2024-07-28: qty 1

## 2024-07-28 MED ORDER — DIAZEPAM 5 MG PO TABS
5.0000 mg | ORAL_TABLET | Freq: Once | ORAL | Status: AC
  Administered 2024-07-28: 5 mg via ORAL
  Filled 2024-07-28: qty 1

## 2024-07-28 MED ORDER — CIPROFLOXACIN HCL 500 MG PO TABS: 500.0000 mg | ORAL_TABLET | Freq: Two times a day (BID) | ORAL | 0 refills | Status: DC

## 2024-07-28 MED ORDER — OXYCODONE HCL 5 MG PO TABS
5.0000 mg | ORAL_TABLET | Freq: Once | ORAL | Status: AC
  Administered 2024-07-28: 5 mg via ORAL
  Filled 2024-07-28: qty 1

## 2024-07-28 MED ORDER — MORPHINE SULFATE 15 MG PO TABS: 7.5000 mg | ORAL_TABLET | ORAL | 0 refills | Status: AC | PRN

## 2024-07-28 MED ORDER — ONDANSETRON 4 MG PO TBDP: ORAL_TABLET | ORAL | 0 refills | Status: AC

## 2024-07-28 NOTE — ED Notes (Signed)
 Pt d/c instructions, medications, and follow-up care reviewed with pt. Pt verbalized understanding and had no further questions at time of d/c. Pt CA&Ox4, ambulatory, and in NAD at time of d/c. Pt d/c with family member who is driving.

## 2024-07-28 NOTE — ED Provider Notes (Signed)
 [place] EMERGENCY DEPARTMENT AT [facility] Provider Note   CSN: [id number] Arrival date & time: 07/28/24  1933     Patient presents with: Back Pain and Abdominal Pain   Krista Sandoval is a 48 y.o. female.   48 yo F with a chief complaint of right flank pain.  This been going on for about 4 days or so now.  Seems to come and go.  Has gotten progressively worse.  Saw her primary care provider today who told her she had blood in her urine and was arranging to have CT imaging.  She felt her pain got worse since then she developed vomiting and came in for evaluation.  Nothing seems to make this better or worse.  No fevers.   Back Pain Associated symptoms: abdominal pain   Abdominal Pain      Prior to Admission medications   Medication Sig Start Date End Date Taking? Authorizing Provider  ciprofloxacin  (CIPRO ) 500 MG tablet Take 1 tablet (500 mg total) by mouth 2 (two) times daily. One po bid x 7 days 07/28/24  Yes Emil Share, DO  metroNIDAZOLE  (FLAGYL ) 500 MG tablet Take 1 tablet (500 mg total) by mouth 2 (two) times daily. 07/28/24  Yes Emil Share, DO  morphine  (MSIR) 15 MG tablet Take 0.5 tablets (7.5 mg total) by mouth every 4 (four) hours as needed for severe pain (pain score 7-10). 07/28/24  Yes Emil Share, DO  ondansetron  (ZOFRAN -ODT) 4 MG disintegrating tablet 4mg  ODT q4 hours prn nausea/vomit 07/28/24  Yes Jovannie Ulibarri, DO  cyclobenzaprine  (FLEXERIL ) 10 MG tablet Take 1 tablet (10 mg total) by mouth 2 (two) times daily as needed for muscle spasms. 09/22/19   Dreama Longs, MD  dicyclomine  (BENTYL ) 20 MG tablet Take 1 tablet (20 mg total) by mouth 2 (two) times daily as needed for spasms. 02/11/23   Silver Fell A, PA  fluticasone (FLONASE) 50 MCG/ACT nasal spray Place 1 spray into both nostrils daily. 01/25/23   [provider]  HYDROcodone -acetaminophen  (NORCO/VICODIN) 5-325 MG tablet Take 1 tablet by mouth every 6 (six) hours as needed for severe pain. 02/13/23    Roselyn Carlin NOVAK, MD  ibuprofen  (ADVIL ,MOTRIN ) 600 MG tablet Take 1 tablet (600 mg total) by mouth every 6 (six) hours as needed. 09/24/18   McDonald, Mia A, PA-C  levocetirizine (XYZAL) 5 MG tablet Take 5 mg by mouth every evening. 08/27/22   [provider]  Multiple Vitamins-Minerals (MULTIVITAMIN PO) Take 1 tablet by mouth daily. Prenatal vitamin    [provider]  ondansetron  (ZOFRAN ) 4 MG tablet Take 1 tablet (4 mg total) by mouth every 6 (six) hours. 02/11/23   Silver Fell LABOR, PA  SUMAtriptan (IMITREX) 50 MG tablet Take 50 mg by mouth 3 (three) times daily as needed. 08/16/19   [provider]  topiramate (TOPAMAX) 25 MG tablet Take 25 mg by mouth daily.    [provider]    Allergies: Amoxicillin    Review of Systems  Gastrointestinal:  Positive for abdominal pain.  Musculoskeletal:  Positive for back pain.    Updated Vital Signs BP 99/68   Pulse 66   Temp 98.5 F (36.9 C)   Resp 18   Ht 5' 4 (1.626 m)   Wt 68 kg   SpO2 98%   BMI 25.75 kg/m   Physical Exam Vitals and nursing note reviewed.  Constitutional:      General: She is not in acute distress.    Appearance: She  is well-developed. She is not diaphoretic.  HENT:     Head: Normocephalic and atraumatic.  Eyes:     Pupils: Pupils are equal, round, and reactive to light.  Cardiovascular:     Rate and Rhythm: Normal rate and regular rhythm.     Heart sounds: No murmur heard.    No friction rub. No gallop.  Pulmonary:     Effort: Pulmonary effort is normal.     Breath sounds: No wheezing or rales.  Abdominal:     General: There is no distension.     Palpations: Abdomen is soft.     Tenderness: There is no abdominal tenderness.     Comments: No obvious discomfort on abdominal exam.  No CVA tenderness.  Musculoskeletal:        General: No tenderness.     Cervical back: Normal range of motion and neck supple.  Skin:    General: Skin is warm and dry.  Neurological:      Mental Status: She is alert and oriented to person, place, and time.  Psychiatric:        Behavior: Behavior normal.     (all labs ordered are listed, but only abnormal results are displayed) Labs Reviewed  URINALYSIS, ROUTINE W REFLEX MICROSCOPIC - Abnormal; Notable for the following components:      Result Value   Leukocytes,Ua TRACE (*)    Bacteria, UA RARE (*)    All other components within normal limits  CBC WITH DIFFERENTIAL/PLATELET - Abnormal; Notable for the following components:   HCT 35.5 (*)    All other components within normal limits  COMPREHENSIVE METABOLIC PANEL WITH GFR - Abnormal; Notable for the following components:   Glucose, Bld 108 (*)    Alkaline Phosphatase 130 (*)    All other components within normal limits  PREGNANCY, URINE    EKG: None  Radiology: CT Renal Stone Study Result Date: 07/28/2024 CLINICAL DATA:  Abdominal/flank pain, nephrolithiasis EXAM: CT ABDOMEN AND PELVIS WITHOUT CONTRAST TECHNIQUE: Multidetector CT imaging of the abdomen and pelvis was performed following the standard protocol without IV contrast. RADIATION DOSE REDUCTION: This exam was performed according to the departmental dose-optimization program which includes automated exposure control, adjustment of the mA and/or kV according to patient size and/or use of iterative reconstruction technique. COMPARISON:  CT abdomen pelvis performed at [facility] at 3:04 p.m. FINDINGS: Lower chest: No acute abnormality. Hepatobiliary: No focal liver abnormality is seen. No gallstones, gallbladder wall thickening, or biliary dilatation. Pancreas: Unremarkable Spleen: Unremarkable Adrenals/Urinary Tract: Adrenal glands are unremarkable. Kidneys are normal, without renal calculi, focal lesion, or hydronephrosis. Bladder is unremarkable. Stomach/Bowel: There is focal inflammatory change involving the mid sigmoid colon in keeping with changes of acute uncomplicated sigmoid diverticulitis. Background  moderate descending and sigmoid colonic diverticulosis. No evidence of obstruction or perforation. No free intraperitoneal gas or fluid. No loculated pericolonic fluid collections. The stomach, small bowel, and large bowel are otherwise unremarkable. Appendix normal. Vascular/Lymphatic: Aortic atherosclerosis. No enlarged abdominal or pelvic lymph nodes. Reproductive: Uterus and bilateral adnexa are unremarkable. Other: No abdominal wall hernia or abnormality. No abdominopelvic ascites. Musculoskeletal: No acute bone abnormality. No lytic or blastic bone lesion. Osseous structures are age appropriate. IMPRESSION: 1. Stable examination with mild acute uncomplicated sigmoid diverticulitis. No evidence of obstruction or perforation. 2. No nephro or urolithiasis. Aortic Atherosclerosis (ICD10-I70.0). Electronically Signed   By: Dorethia Molt M.D.   On: 07/28/2024 22:32     Procedures   Medications Ordered in the ED  acetaminophen  (TYLENOL ) tablet 1,000 mg (1,000 mg Oral Given 07/28/24 2201)  ketorolac  (TORADOL ) 15 MG/ML injection 15 mg (15 mg Intramuscular Given 07/28/24 2202)  oxyCODONE  (Oxy IR/ROXICODONE ) immediate release tablet 5 mg (5 mg Oral Given 07/28/24 2201)  diazepam  (VALIUM ) tablet 5 mg (5 mg Oral Given 07/28/24 2201)                                    Medical Decision Making Amount and/or Complexity of Data Reviewed Labs: ordered. Radiology: ordered.  Risk OTC drugs. Prescription drug management.   48 yo F with a chief complaint of right-sided abdominal pain.  This been going on for about 4 or 5 days now.  Reportedly saw her PCP today who was concerned that she needed CT imaging and patient developed worse pain and so came here for evaluation.  No leukocytosis, UA negative for infection though was contaminated.  Will obtain CT stone study.  No leukocytosis, urine negative for infection.  CT scans concerning for acute diverticulitis.  Will start on oral antibiotics.  Pain medicine.   PCP GI follow-up.  11:02 PM:  I have discussed the diagnosis/risks/treatment options with the patient and family.  Evaluation and diagnostic testing in the emergency department does not suggest an emergent condition requiring admission or immediate intervention beyond what has been performed at this time.  They will follow up with PCP, GI. We also discussed returning to the ED immediately if new or worsening sx occur. We discussed the sx which are most concerning (e.g., sudden worsening pain, fever, inability to tolerate by mouth) that necessitate immediate return. Medications administered to the patient during their visit and any new prescriptions provided to the patient are listed below.  Medications given during this visit Medications  acetaminophen  (TYLENOL ) tablet 1,000 mg (1,000 mg Oral Given 07/28/24 2201)  ketorolac  (TORADOL ) 15 MG/ML injection 15 mg (15 mg Intramuscular Given 07/28/24 2202)  oxyCODONE  (Oxy IR/ROXICODONE ) immediate release tablet 5 mg (5 mg Oral Given 07/28/24 2201)  diazepam  (VALIUM ) tablet 5 mg (5 mg Oral Given 07/28/24 2201)     The patient appears reasonably screen and/or stabilized for discharge and I doubt any other medical condition or other [facility] requiring further screening, evaluation, or treatment in the ED at this time prior to discharge.       Final diagnoses:  Diverticulitis    ED Discharge Orders          Ordered    ciprofloxacin  (CIPRO ) 500 MG tablet  2 times daily        07/28/24 2254    metroNIDAZOLE  (FLAGYL ) 500 MG tablet  2 times daily        07/28/24 2254    morphine  (MSIR) 15 MG tablet  Every 4 hours PRN        07/28/24 2254    ondansetron  (ZOFRAN -ODT) 4 MG disintegrating tablet        07/28/24 2254               Emil Share, DO 07/28/24 2302

## 2024-07-28 NOTE — Discharge Instructions (Signed)
 Your CT scan shows diverticulitis.  Please follow-up with your gastroenterologist and PCP in the office.  Take 4 over the counter ibuprofen  tablets 3 times a day or 2 over-the-counter naproxen tablets twice a day for pain. Also take tylenol  1000mg (2 extra strength) four times a day.     Then take the pain medicine if you feel like you need it. Narcotics do not help with the pain, they only make you care about it less.  You can become addicted to this, people may break into your house to steal it.  It will constipate you.  If you drive under the influence of this medicine you can get a DUI.

## 2024-07-28 NOTE — ED Triage Notes (Signed)
  Patient comes in with lower back pain and lower abdominal pain that has been going on since Saturday.  Patient states she saw her PCP today and was told she had blood in her urine.  States PCP was going to schedule a CT scan.  Endorses N/V, and discomfort when urinating.  Pain 10/10, tender.
